# Patient Record
Sex: Female | Born: 1940 | Race: White | Hispanic: No | Marital: Married | Smoking: Former smoker
Health system: Southern US, Community
[De-identification: ages and names within clinical notes are randomized; demographics above are authoritative.]

## PROBLEM LIST (undated history)

## (undated) DIAGNOSIS — E785 Hyperlipidemia, unspecified: Secondary | ICD-10-CM

## (undated) DIAGNOSIS — I10 Essential (primary) hypertension: Secondary | ICD-10-CM

## (undated) DIAGNOSIS — M1990 Unspecified osteoarthritis, unspecified site: Secondary | ICD-10-CM

## (undated) DIAGNOSIS — I4891 Unspecified atrial fibrillation: Secondary | ICD-10-CM

## (undated) DIAGNOSIS — N6019 Diffuse cystic mastopathy of unspecified breast: Secondary | ICD-10-CM

## (undated) HISTORY — DX: Diffuse cystic mastopathy of unspecified breast: N60.19

## (undated) HISTORY — DX: Unspecified atrial fibrillation: I48.91

## (undated) HISTORY — DX: Essential (primary) hypertension: I10

## (undated) HISTORY — DX: Hyperlipidemia, unspecified: E78.5

## (undated) HISTORY — DX: Unspecified osteoarthritis, unspecified site: M19.90

---

## 1968-06-05 DIAGNOSIS — M1990 Unspecified osteoarthritis, unspecified site: Secondary | ICD-10-CM

## 1968-06-05 HISTORY — DX: Unspecified osteoarthritis, unspecified site: M19.90

## 1972-06-05 HISTORY — PX: ABDOMINAL HYSTERECTOMY: SHX81

## 1993-06-05 DIAGNOSIS — I10 Essential (primary) hypertension: Secondary | ICD-10-CM

## 1993-06-05 HISTORY — DX: Essential (primary) hypertension: I10

## 2008-07-06 ENCOUNTER — Ambulatory Visit: Payer: Self-pay | Admitting: Internal Medicine

## 2008-08-04 ENCOUNTER — Ambulatory Visit: Payer: Self-pay | Admitting: Family Medicine

## 2008-09-23 ENCOUNTER — Emergency Department: Payer: Self-pay | Admitting: Emergency Medicine

## 2008-10-19 DIAGNOSIS — D329 Benign neoplasm of meninges, unspecified: Secondary | ICD-10-CM

## 2008-10-20 ENCOUNTER — Emergency Department: Payer: Self-pay | Admitting: Emergency Medicine

## 2008-10-24 ENCOUNTER — Ambulatory Visit: Payer: Self-pay | Admitting: Internal Medicine

## 2008-10-24 ENCOUNTER — Emergency Department: Payer: Self-pay | Admitting: Emergency Medicine

## 2008-11-12 ENCOUNTER — Emergency Department: Payer: Self-pay | Admitting: Emergency Medicine

## 2009-06-05 DIAGNOSIS — N6019 Diffuse cystic mastopathy of unspecified breast: Secondary | ICD-10-CM

## 2009-06-05 HISTORY — PX: BREAST MASS EXCISION: SHX1267

## 2009-06-05 HISTORY — DX: Diffuse cystic mastopathy of unspecified breast: N60.19

## 2009-07-24 ENCOUNTER — Emergency Department: Payer: Self-pay | Admitting: Emergency Medicine

## 2009-08-06 ENCOUNTER — Emergency Department: Payer: Self-pay | Admitting: Emergency Medicine

## 2009-09-20 ENCOUNTER — Ambulatory Visit: Payer: Self-pay | Admitting: Family Medicine

## 2010-02-01 ENCOUNTER — Ambulatory Visit: Payer: Self-pay | Admitting: General Surgery

## 2010-02-09 ENCOUNTER — Ambulatory Visit: Payer: Self-pay | Admitting: General Surgery

## 2010-02-18 ENCOUNTER — Ambulatory Visit: Payer: Self-pay | Admitting: Internal Medicine

## 2011-02-03 DIAGNOSIS — E7800 Pure hypercholesterolemia, unspecified: Secondary | ICD-10-CM | POA: Insufficient documentation

## 2011-02-03 DIAGNOSIS — I10 Essential (primary) hypertension: Secondary | ICD-10-CM | POA: Insufficient documentation

## 2011-02-28 ENCOUNTER — Encounter: Payer: Self-pay | Admitting: Family Medicine

## 2011-03-06 ENCOUNTER — Encounter: Payer: Self-pay | Admitting: Family Medicine

## 2011-06-06 HISTORY — PX: COLONOSCOPY: SHX174

## 2011-06-16 ENCOUNTER — Ambulatory Visit: Payer: Self-pay | Admitting: Unknown Physician Specialty

## 2011-06-19 LAB — PATHOLOGY REPORT

## 2011-10-02 ENCOUNTER — Emergency Department: Payer: Self-pay | Admitting: Emergency Medicine

## 2012-01-06 ENCOUNTER — Ambulatory Visit: Payer: Self-pay | Admitting: Medical

## 2012-01-06 LAB — URINALYSIS, COMPLETE
Ketone: NEGATIVE
Nitrite: NEGATIVE
Ph: 5 (ref 4.5–8.0)

## 2012-01-08 LAB — URINE CULTURE

## 2012-01-10 DIAGNOSIS — R319 Hematuria, unspecified: Secondary | ICD-10-CM | POA: Insufficient documentation

## 2012-07-26 ENCOUNTER — Encounter: Payer: Self-pay | Admitting: *Deleted

## 2012-07-26 DIAGNOSIS — I10 Essential (primary) hypertension: Secondary | ICD-10-CM | POA: Insufficient documentation

## 2012-09-18 ENCOUNTER — Ambulatory Visit: Payer: Self-pay | Admitting: General Surgery

## 2012-10-17 ENCOUNTER — Encounter: Payer: Self-pay | Admitting: General Surgery

## 2012-10-24 ENCOUNTER — Ambulatory Visit: Payer: Self-pay | Admitting: General Surgery

## 2012-11-07 ENCOUNTER — Ambulatory Visit: Payer: Self-pay | Admitting: General Surgery

## 2012-11-21 ENCOUNTER — Ambulatory Visit (INDEPENDENT_AMBULATORY_CARE_PROVIDER_SITE_OTHER): Payer: Medicare Other | Admitting: General Surgery

## 2012-11-21 ENCOUNTER — Encounter: Payer: Self-pay | Admitting: General Surgery

## 2012-11-21 VITALS — BP 154/74 | HR 74 | Resp 14 | Ht 65.0 in | Wt 209.0 lb

## 2012-11-21 DIAGNOSIS — Z1239 Encounter for other screening for malignant neoplasm of breast: Secondary | ICD-10-CM

## 2012-11-21 NOTE — Progress Notes (Signed)
Patient ID: Amanda Mccann, female   DOB: [birth year], 72 y.o.   MRN: [id number]  Chief Complaint  Patient presents with  . Follow-up    mammogram    HPI Amanda Mccann is a 72 y.o. female Who presents for a breast evaluation. The most recent mammogram was done on 09/12/12.  Patient does perform regular self breast checks and gets regular mammograms done. No family history of breast cancer. Patient had an left breast mass excision in 2011.   HPI  Past Medical History  Diagnosis Date  . Hypertension 1995  . Arthritis 1970  . Diffuse cystic mastopathy 2011    left breast subareolar mass    Past Surgical History  Procedure Laterality Date  . Colonoscopy  2013    DR.Elliott  . Breast mass excision Left 2011  . Abdominal hysterectomy  1974    History reviewed. No pertinent family history.  Social History History  Substance Use Topics  . Smoking status: Former Smoker -- 1.00 packs/day for 10 years  . Smokeless tobacco: Not on file  . Alcohol Use: No    Allergies  Allergen Reactions  . Darvon (Propoxyphene Hcl) Swelling  . Indocin (Indomethacin) Swelling    Current Outpatient Prescriptions  Medication Sig Dispense Refill  . benazepril (LOTENSIN) 20 MG tablet Take 20 mg by mouth daily.      . Calcium Carbonate-Vitamin D (CALCIUM + D PO) Take 2 tablets by mouth daily.      Marland Kitchen losartan (COZAAR) 100 MG tablet Take 100 mg by mouth daily.      . metoprolol (LOPRESSOR) 50 MG tablet Take 50 mg by mouth 2 (two) times daily.      . Multiple Vitamins-Minerals (MULTIVITAMIN WITH MINERALS) tablet Take 1 tablet by mouth daily.      . sertraline (ZOLOFT) 50 MG tablet Take 150 mg by mouth daily.       No current facility-administered medications for this visit.    Review of Systems Review of Systems  Constitutional: Negative.   Respiratory: Negative.   Cardiovascular: Negative.     Blood pressure 154/74, pulse 74, resp. rate 14, height 5\' 5"  (1.651 m), weight 209 lb (94.802  kg).  Physical Exam Physical Exam  Constitutional: She appears well-developed and well-nourished.  Eyes: No scleral icterus.  Neck: No mass and no thyromegaly present.  Cardiovascular: Normal rate, regular rhythm and normal heart sounds.   Pulmonary/Chest: Effort normal and breath sounds normal. Right breast exhibits no inverted nipple, no mass, no nipple discharge, no skin change and no tenderness. Left breast exhibits no inverted nipple, no mass, no nipple discharge, no skin change and no tenderness.  Lymphadenopathy:    She has no cervical adenopathy.    She has no axillary adenopathy.  Neurological: She is alert.  Skin: Skin is warm and dry.    Data Reviewed Mammogram reviewed   Assessment    Exam stable     Plan   Patient to return in one year  Bilateral screening  mammogram.       SANKAR,SEEPLAPUTHUR G 11/21/2012, 9:01 PM

## 2012-11-21 NOTE — Patient Instructions (Addendum)
Patient to return in one year bilateral mammogram.

## 2013-10-09 ENCOUNTER — Inpatient Hospital Stay: Payer: Self-pay | Admitting: Internal Medicine

## 2013-10-09 LAB — URINALYSIS, COMPLETE
BILIRUBIN, UR: NEGATIVE
Bacteria: NONE SEEN
GLUCOSE, UR: NEGATIVE mg/dL (ref 0–75)
KETONE: NEGATIVE
Nitrite: NEGATIVE
Ph: 7 (ref 4.5–8.0)
Protein: NEGATIVE
RBC,UR: 1 /HPF (ref 0–5)
Specific Gravity: 1.004 (ref 1.003–1.030)
WBC UR: 7 /HPF (ref 0–5)

## 2013-10-09 LAB — BASIC METABOLIC PANEL
Anion Gap: 5 — ABNORMAL LOW (ref 7–16)
BUN: 21 mg/dL — ABNORMAL HIGH (ref 7–18)
CALCIUM: 9.2 mg/dL (ref 8.5–10.1)
CO2: 27 mmol/L (ref 21–32)
CREATININE: 0.83 mg/dL (ref 0.60–1.30)
Chloride: 108 mmol/L — ABNORMAL HIGH (ref 98–107)
EGFR (African American): >60
EGFR (Non-African Amer.): >60
GLUCOSE: 108 mg/dL — AB (ref 65–99)
Osmolality: 283 (ref 275–301)
POTASSIUM: 3.9 mmol/L (ref 3.5–5.1)
Sodium: 140 mmol/L (ref 136–145)

## 2013-10-09 LAB — CBC
HCT: 39.8 % (ref 35.0–47.0)
HGB: 13.3 g/dL (ref 12.0–16.0)
MCH: 29.6 pg (ref 26.0–34.0)
MCHC: 33.4 g/dL (ref 32.0–36.0)
MCV: 89 fL (ref 80–100)
Platelet: 290 10*3/uL (ref 150–440)
RBC: 4.5 10*6/uL (ref 3.80–5.20)
RDW: 13.9 % (ref 11.5–14.5)
WBC: 9.6 10*3/uL (ref 3.6–11.0)

## 2013-10-09 LAB — TSH: Thyroid Stimulating Horm: 1.28 u[IU]/mL

## 2013-10-09 LAB — TROPONIN I
TROPONIN-I: 0.04 ng/mL
Troponin-I: 0.05 ng/mL

## 2013-10-09 LAB — PRO B NATRIURETIC PEPTIDE: B-Type Natriuretic Peptide: 157 pg/mL — ABNORMAL HIGH (ref 0–125)

## 2013-10-09 LAB — CK-MB
CK-MB: 0.9 ng/mL (ref 0.5–3.6)
CK-MB: 0.9 ng/mL (ref 0.5–3.6)

## 2013-10-30 DIAGNOSIS — I480 Paroxysmal atrial fibrillation: Secondary | ICD-10-CM | POA: Insufficient documentation

## 2013-11-28 ENCOUNTER — Encounter: Payer: Self-pay | Admitting: General Surgery

## 2013-12-03 DIAGNOSIS — Z7901 Long term (current) use of anticoagulants: Secondary | ICD-10-CM | POA: Insufficient documentation

## 2013-12-09 ENCOUNTER — Ambulatory Visit: Payer: Medicare Other | Admitting: General Surgery

## 2013-12-24 ENCOUNTER — Ambulatory Visit: Payer: Medicare Other | Admitting: General Surgery

## 2013-12-27 ENCOUNTER — Emergency Department: Payer: Self-pay | Admitting: Emergency Medicine

## 2013-12-27 LAB — BASIC METABOLIC PANEL
ANION GAP: 7 (ref 7–16)
BUN: 18 mg/dL (ref 7–18)
CHLORIDE: 106 mmol/L (ref 98–107)
CO2: 26 mmol/L (ref 21–32)
CREATININE: 0.82 mg/dL (ref 0.60–1.30)
Calcium, Total: 8.5 mg/dL (ref 8.5–10.1)
EGFR (Non-African Amer.): >60
Glucose: 95 mg/dL (ref 65–99)
Osmolality: 279 (ref 275–301)
POTASSIUM: 3.8 mmol/L (ref 3.5–5.1)
Sodium: 139 mmol/L (ref 136–145)

## 2013-12-27 LAB — PROTIME-INR
INR: 2.2
Prothrombin Time: 23.8 secs — ABNORMAL HIGH (ref 11.5–14.7)

## 2013-12-27 LAB — CBC
HCT: 37 % (ref 35.0–47.0)
HGB: 12.6 g/dL (ref 12.0–16.0)
MCH: 30.1 pg (ref 26.0–34.0)
MCHC: 34.2 g/dL (ref 32.0–36.0)
MCV: 88 fL (ref 80–100)
Platelet: 292 10*3/uL (ref 150–440)
RBC: 4.19 10*6/uL (ref 3.80–5.20)
RDW: 14 % (ref 11.5–14.5)
WBC: 9.6 10*3/uL (ref 3.6–11.0)

## 2013-12-27 LAB — TROPONIN I

## 2013-12-28 LAB — TROPONIN I

## 2014-01-06 ENCOUNTER — Encounter: Payer: Self-pay | Admitting: General Surgery

## 2014-01-06 ENCOUNTER — Ambulatory Visit (INDEPENDENT_AMBULATORY_CARE_PROVIDER_SITE_OTHER): Payer: Medicare HMO | Admitting: General Surgery

## 2014-01-06 VITALS — BP 130/82 | HR 68 | Resp 14 | Ht 65.0 in | Wt 207.0 lb

## 2014-01-06 DIAGNOSIS — N6019 Diffuse cystic mastopathy of unspecified breast: Secondary | ICD-10-CM

## 2014-01-06 NOTE — Patient Instructions (Signed)
Patient to return in 1 year with bilateral screening mammogram. Continue self breast exams. Call office for any new breast issues or concerns.  

## 2014-01-06 NOTE — Progress Notes (Signed)
Patient ID: Amanda Mccann, female   DOB: Jul 10, 1940, 73 y.o.   MRN: [id number]  Chief Complaint  Patient presents with  . Follow-up    mammogram    HPI Amanda Mccann is a 73 y.o. female who presents for a breast evaluation. The most recent mammogram was done on 11/27/13. Patient does perform regular self breast checks and gets regular mammograms done.  No complaints at this time. Since last visit the patient has developed Afib with a heart rate up to 140 when diagnosed. Now doing ok, she is on Warfarin.   HPI  Past Medical History  Diagnosis Date  . Hypertension 1995  . Arthritis 1970  . Diffuse cystic mastopathy 2011    left breast subareolar mass  . A-fib     Past Surgical History  Procedure Laterality Date  . Colonoscopy  2013    DR.Elliott  . Breast mass excision Left 2011  . Abdominal hysterectomy  1974    History reviewed. No pertinent family history.  Social History History  Substance Use Topics  . Smoking status: Former Smoker -- 1.00 packs/day for 10 years  . Smokeless tobacco: Not on file  . Alcohol Use: No    Allergies  Allergen Reactions  . Darvon [Propoxyphene Hcl] Swelling  . Indocin [Indomethacin] Swelling    Current Outpatient Prescriptions  Medication Sig Dispense Refill  . amLODipine (NORVASC) 5 MG tablet Take 10 mg by mouth daily.      . benazepril (LOTENSIN) 20 MG tablet Take 60 mg by mouth daily.       . Calcium Carbonate-Vitamin D (CALCIUM + D PO) Take 2 tablets by mouth daily.      . hydrochlorothiazide (HYDRODIURIL) 25 MG tablet Take 25 mg by mouth as needed.       Marland Kitchen losartan (COZAAR) 100 MG tablet Take 100 mg by mouth daily.      . metoprolol succinate (TOPROL-XL) 50 MG 24 hr tablet Take 50 mg by mouth daily. Take with or immediately following a meal.      . Multiple Vitamins-Minerals (MULTIVITAMIN WITH MINERALS) tablet Take 1 tablet by mouth daily.      . sertraline (ZOLOFT) 50 MG tablet Take 150 mg by mouth daily.      Marland Kitchen warfarin  (COUMADIN) 1 MG tablet 0.5 mg by mouth MWF and 2.5 mg all other days.       No current facility-administered medications for this visit.    Review of Systems Review of Systems  Constitutional: Negative.   Respiratory: Negative.   Cardiovascular: Negative.     Blood pressure 130/82, pulse 68, resp. rate 14, height 5\' 5"  (1.651 m), weight 207 lb (93.895 kg).  Physical Exam Physical Exam  Constitutional: She is oriented to person, place, and time. She appears well-developed and well-nourished.  Neck: Neck supple. No thyromegaly present.  Cardiovascular: Normal rate, regular rhythm and normal heart sounds.   No murmur heard. Pulmonary/Chest: Effort normal and breath sounds normal. Right breast exhibits no inverted nipple, no mass, no nipple discharge, no skin change and no tenderness. Left breast exhibits no inverted nipple, no mass, no nipple discharge, no skin change and no tenderness.  Lymphadenopathy:    She has no cervical adenopathy.    She has no axillary adenopathy.  Neurological: She is alert and oriented to person, place, and time.  Skin: Skin is warm and dry.    Data Reviewed Mammogram reviewed and stable.   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 Current exam stable.     Plan    1 year follow bilateral screening mammogram.        Gionni Freese G 01/07/2014, 2:11 PM

## 2014-01-07 ENCOUNTER — Encounter: Payer: Self-pay | Admitting: General Surgery

## 2014-04-06 ENCOUNTER — Encounter: Payer: Self-pay | Admitting: General Surgery

## 2014-05-14 ENCOUNTER — Ambulatory Visit: Payer: Self-pay | Admitting: Family Medicine

## 2014-06-24 DIAGNOSIS — E785 Hyperlipidemia, unspecified: Secondary | ICD-10-CM | POA: Insufficient documentation

## 2014-09-26 NOTE — H&P (Signed)
PATIENT NAME:  Amanda Mccann, Amanda Mccann MR#:  [id number] DATE OF BIRTH:  August 26, 1940  DATE OF ADMISSION:  10/09/2013  PRIMARY CARE PHYSICIAN:  Dr. Gayland Curry.   REFERRING PHYSICIAN:  Dr. Reita Cliche.   CHIEF COMPLAINT:  Palpitations.   HISTORY OF PRESENT ILLNESS:  The patient is a 74 year old pleasant Caucasian female with past medical history of hypertension and anxiety who is presenting to the ER with a chief complaint of more than one hour history of palpitations.  The patient was in her usual state of health until last night.  The patient woke up from sleep at around midnight to go to bathroom and while she was in the bathroom she started having palpitations in her chest.  She was uncomfortable with the chest discomfort.  Denies any dizziness or loss of consciousness.  The patient went back to bed and laid down to see whether the chest discomfort will be resolved.  The patient's chest discomfort was not resolved and was having persistent palpitations which prompted her come to the ER.  In the ER, the patient was found with atrial fibrillation with RVR at 160 beats per minute.  The patient was given Cardizem 10 mg IV push 2 times with no significant improvement.  The patient is started on Cardizem drip and hospitalist team is called to admit the patient.  During my examination, the patient's heart rate is trending down up to 110 to 120s, but with exertion it is going up to 140s.  Husband is at bedside.  No similar complaints in the past.  Never had any heart attack or palpitations in the past.  Not seen by any cardiologist in the past.   PAST MEDICAL HISTORY:  Hypertension and anxiety.   PAST SURGICAL HISTORY:  Hysterectomy for endometriosis in 1974.   ALLERGIES:  CALCIUM CARBONATE, INDOCIN, DARVON.   PSYCHOSOCIAL HISTORY:  Lives at home with husband.  No history of smoking, alcohol or illicit drug usage.  SHE IS FULL CODE.   FAMILY HISTORY:  Mother had history of hypertension.  Grandmom has history of  stroke.    HOME MEDICATIONS:  Calcium carbonate with vitamin D 600 mg by mouth 2 times a day, benazepril 20 mg 2 tablets by mouth once daily in a.m., aspirin 81 mg by mouth once daily, losartan 100 mg by mouth once daily, hydrochlorothiazide 25 mg 1 tablet by mouth q.a.m., ciprofloxacin 500 mg 2 times a day, but the patient denies taking Cipro.  Sertraline 50 mg by mouth 2 times a day, multivitamin 1 tablet by mouth once daily.   REVIEW OF SYSTEMS:  CONSTITUTIONAL:  Denies any fever or fatigue.  EYES:  Denies blurry vision, double vision, glaucoma, cataracts.  EARS, NOSE, THROAT:  Denies epistaxis or discharge.  RESPIRATION:  Denies cough, COPD or tuberculosis.  CARDIOVASCULAR:  No chest pain.  Complaining of palpitations.  Denies any dizziness or loss of consciousness.   GASTROINTESTINAL:  Denies any hematemesis, melena, nausea, vomiting, diarrhea or abdominal pain.   NEUROLOGIC:  Denies any history of vertigo, ataxia, stroke or TIA in the past.  MUSCULOSKELETAL:  Denies any osteoarthritis, gout.  HEMATOLOGIC AND LYMPHATIC:  Denies any anemia or recent cancers, recent history of blood losses.  PSYCHIATRIC:  Denies any ADD, OCD, anxiety, depression.  SKIN:  No rashes.  No lesions.   LABORATORY AND IMAGING STUDIES:  CBC normal.  Urinalysis:  Leukocyte esterase is 1+, nitrite is negative.  Chem-8:  Glucose is 108, BNP 157, BUN 21, chloride 108.  Anion gap  5.  The rest of the BMP is normal.  Chest x-ray, portable:  Mild cardiomegaly.  No effusion or pneumothorax.  Negative for edema.  A 12-lead EKG:  Atrial fibrillation with RVR at 160 beats per minute.  Significant ST-T abnormalities, but no acute ST-T wave changes.   ASSESSMENT AND PLAN:  A 74 year old Caucasian female presenting to the Emergency Room with a chief complaint of palpitations for more than one hour.  1.  New onset atrial fibrillation with rapid ventricular rate.  We will admit her to Critical Care Unit.  The patient is on Cardizem  drip.  We will continue that.  We will continue the patient's home medication, beta blocker.  She will be on aspirin.  Lovenox 1 mg/kg subQ q. 12 hours, but her CHADS score is low, 1 to 2, probably the patient will be discharged home with aspirin for anticoagulation, but cardiology consult is placed to Dr. Neoma Laming on-call cardiologist, regarding anticoagulation.  Cycle cardiac biomarkers and we will obtain 2-D echocardiogram. 2.  Hypertension.  Resume her home medications and up-titrate medications as needed basis.  3.  History of anxiety.  Continue her home medication.   4.  We will provide gastrointestinal prophylaxis and deep vein thrombosis prophylaxis with Lovenox subQ.   The diagnosis and plan of care was discussed in detail with the patient and her husband at bedside.  They both noted understanding of the plan.   Total critical care time spent is 50 minutes.     ____________________________ Nicholes Mango, MD ag:ea D: 10/09/2013 03:24:29 ET T: 10/09/2013 04:50:45 ET JOB#: [id number]  cc: Nicholes Mango, MD, <Dictator> Dionisio David, MD Floria Raveling. Astrid Divine, MD  Nicholes Mango MD ELECTRONICALLY SIGNED 10/12/2013 4:37

## 2014-09-26 NOTE — Consult Note (Signed)
PATIENT NAME:  Amanda Mccann, Amanda Mccann MR#:  [id number] DATE OF BIRTH:  01/31/1941  DATE OF CONSULTATION:  10/09/2013  CONSULTING PHYSICIAN:  Dionisio David, MD  INDICATION FOR CONSULTATION: Atrial fibrillation, chest pain.   HISTORY OF PRESENT ILLNESS: This is a 74 year old white female with a past medical history of hypertension, takes Toprol-XL 50 mg once a day and lisinopril, presented to the Emergency Room with new onset atrial fibrillation. The patient had chest pain described as tightness in the chest associated with palpitations. She has converted to sinus rhythm with IV Cardizem.   PAST MEDICAL HISTORY: History of hypertension. Her CHADS2 1 to 2.   SOCIAL HISTORY:  Denies EtOH abuse or smoking.   FAMILY HISTORY:  Positive for coronary artery disease.   PHYSICAL EXAMINATION: GENERAL:  She is alert, oriented x 3, in no acute distress.  VITAL SIGNS:  Stable. Heart rate is 56. Monitor shows sinus rhythm.  NECK:  No JVD.  LUNGS:  Clear.  HEART:  Regular rate and rhythm. Normal S1, S2. No audible murmur.  ABDOMEN:  Soft, nontender, positive bowel sounds.  EXTREMITIES:  No pedal edema.   EKG shows Afib with ventricular rate of 160 with ST depression inferolaterally.   Troponins were apparently negative.   Echo showed normal EF, mild MR, trace TR with septal hypokinesis suggestive of coronary artery disease.   ASSESSMENT AND PLAN: The patient has atrial fibrillation with rapid ventricular response rate, responded with Cardizem and became sinus rhythm. Agree with discharging the patient on metoprolol tartrate 50 b.i.d., aspirin, and we will schedule a stress test at 8:30 on Monday in the office, which has already been scheduled to rule out coronary artery disease.    ____________________________ Dionisio David, MD sak:dmm D: 10/09/2013 12:50:44 ET T: 10/09/2013 13:07:05 ET JOB#: [id number]  cc: Dionisio David, MD, <Dictator> [place], [place]  MD ELECTRONICALLY SIGNED 10/10/2013 11:07

## 2014-09-26 NOTE — Discharge Summary (Signed)
PATIENT NAME:  Amanda Mccann, COOTE MR#:  [id number] DATE OF BIRTH:  [birth year]  DATE OF ADMISSION:  10/09/2013 DATE OF DISCHARGE:  10/09/2013  PRIMARY CARE PHYSICIAN:  Dr. Gayland Curry.   FINAL DIAGNOSES:  1. Rapid atrial fibrillation.  2. Urinary tract infection.  3. Hypertension.  4. Anxiety.   MEDICATIONS ON DISCHARGE: Include aspirin 81 mg at bedtime, benazepril 20 mg 2 tablets in a.m. and1 tablet in the p.m., losartan 100 mg at bedtime, multivitamin 1 tablet in the a.m., Zoloft 50 mg twice a day, calcium carbonate bid, vitamin e 400 international units 1 tablet twice a day, metoprolol tartrate 50 mg twice a day, cephalexin 250 mg orally every 8 hours for 4 days, magnesium oxide 400 mg daily for 5 days.   Stop taking hydrochlorothiazide.   DIET: Low sodium diet, regular consistency.   ACTIVITY: As tolerated.   FOLLOWUP:  Dr. Humphrey Rolls, cardiology, Monday at 8: 30 a.m.  One to 2 weeks with Dr. Gayland Curry.   HOSPITAL COURSE: The patient was admitted 10/09/2013.  Discharged same day.  Came in with rapid atrial fibrillation. Was admitted to the ICU, started on Cardizem drip.  LABORATORY AND RADIOLOGICAL DATA DURING THE HOSPITAL COURSE: Included a urinalysis, 1+ leukocyte esterase. Troponin negative. White blood cell count 9.6, H and H 13.3 and 39.8, platelet count of 290,000. BNP 157, glucose 108, BUN 21, creatinine 0.83, sodium 140, potassium 3.9, chloride 108, CO2 of 27, calcium 9.2. EKG: Atrial fibrillation, rapid ventricular response, ST depression laterally. Chest x-ray: Mild cardiomegaly. No evidence of edema or pneumonia. TSH 1.28.  Cardiac enzymes negative. Echocardiogram showed EF of 60%-65%, impaired relaxation pattern. No thrombi seen. Hypokinesis septally.  Left ventricular hypertrophy, moderately dilated left atrium, moderately dilated right atrium.   HOSPITAL COURSE PER PROBLEM LIST:  1. For the patient's rapid atrial fibrillation, the patient was placed on Cardizem drip. I  gave the patient metoprolol 50 mg stat before I saw her. Then, she converted over to a normal sinus rhythm. We got her off the Cardizem drip. I switched her Toprol over to metoprolol tartrate 50 mg twice a day. Dr. Humphrey Rolls, cardiology, saw her. Recommended aspirin only for anticoagulation at this point and followup in his office on Monday for a possible stress test. I did prescribe a few days worth of magnesium oxide.  2. Urinary tract infection. The patient was started on Rocephin.  Cephalexin will be prescribed upon discharge.  3. Hypertension. Blood pressure is stable. I did stop the hydrochlorothiazide since I increased the patient to metoprolol twice a day.  4. Anxiety. On Zoloft.   TIME SPENT ON DISCHARGE: 35 minutes.    ____________________________ Tana Conch. Leslye Peer, MD rjw:dd D: 10/09/2013 15:48:22 ET T: 10/10/2013 02:03:53 ET JOB#: [id number]  cc: Tana Conch. Leslye Peer, MD, <Dictator> Floria Raveling. Astrid Divine, MD Dionisio David, MD  Marisue Brooklyn MD ELECTRONICALLY SIGNED 10/12/2013 14:47

## 2014-12-23 ENCOUNTER — Ambulatory Visit: Payer: Medicare HMO | Admitting: General Surgery

## 2015-06-05 ENCOUNTER — Other Ambulatory Visit: Payer: Self-pay | Admitting: Internal Medicine

## 2015-06-05 ENCOUNTER — Encounter: Payer: Self-pay | Admitting: Internal Medicine

## 2015-06-05 DIAGNOSIS — D329 Benign neoplasm of meninges, unspecified: Secondary | ICD-10-CM

## 2015-06-05 DIAGNOSIS — F419 Anxiety disorder, unspecified: Secondary | ICD-10-CM | POA: Insufficient documentation

## 2015-06-09 ENCOUNTER — Ambulatory Visit: Payer: Self-pay | Admitting: Internal Medicine

## 2015-06-13 ENCOUNTER — Encounter: Payer: Self-pay | Admitting: Internal Medicine

## 2015-06-16 ENCOUNTER — Encounter: Payer: Self-pay | Admitting: Internal Medicine

## 2015-06-16 ENCOUNTER — Ambulatory Visit (INDEPENDENT_AMBULATORY_CARE_PROVIDER_SITE_OTHER): Payer: PPO | Admitting: Internal Medicine

## 2015-06-16 VITALS — BP 128/76 | HR 86 | Ht 66.0 in | Wt 209.0 lb

## 2015-06-16 DIAGNOSIS — Z9229 Personal history of other drug therapy: Secondary | ICD-10-CM

## 2015-06-16 DIAGNOSIS — N6011 Diffuse cystic mastopathy of right breast: Secondary | ICD-10-CM | POA: Diagnosis not present

## 2015-06-16 DIAGNOSIS — N6012 Diffuse cystic mastopathy of left breast: Secondary | ICD-10-CM | POA: Diagnosis not present

## 2015-06-16 DIAGNOSIS — I10 Essential (primary) hypertension: Secondary | ICD-10-CM | POA: Diagnosis not present

## 2015-06-16 DIAGNOSIS — I480 Paroxysmal atrial fibrillation: Secondary | ICD-10-CM

## 2015-06-16 DIAGNOSIS — Z1239 Encounter for other screening for malignant neoplasm of breast: Secondary | ICD-10-CM

## 2015-06-16 DIAGNOSIS — E785 Hyperlipidemia, unspecified: Secondary | ICD-10-CM

## 2015-06-16 MED ORDER — BENAZEPRIL HCL 20 MG PO TABS: 60.0000 mg | ORAL_TABLET | Freq: Every day | ORAL | Status: DC

## 2015-06-16 NOTE — Progress Notes (Signed)
Date:  06/16/2015   Name:  Amanda Mccann   DOB:  1940-07-27   MRN:  [id number]   Chief Complaint: New Evaluation; Atrial Fibrillation; and Hypertension Atrial Fibrillation Presents for follow-up (onset 2002) visit. Symptoms include hypertension. Symptoms are negative for chest pain, dizziness, shortness of breath and tachycardia. The symptoms have been resolved. Past treatments include warfarin and beta blockers. Compliance with prior treatments has been good. Past medical history includes atrial fibrillation.  Hypertension This is a chronic problem. The current episode started more than 1 year ago. The problem is unchanged. The problem is controlled (intermittently increases - current therapy for the past 5 years). Associated symptoms include anxiety. Pertinent negatives include no chest pain, headaches or shortness of breath. Past treatments include beta blockers, angiotensin blockers, ACE inhibitors, diuretics and calcium channel blockers. The current treatment provides significant improvement. There are no compliance problems.   Anxiety Presents for follow-up visit. The problem has been unchanged. Symptoms include decreased concentration, nervous/anxious behavior and panic. Patient reports no chest pain, confusion, dizziness or shortness of breath. Symptoms occur rarely.   Past treatments include SSRIs. The treatment provided significant relief. Compliance with prior treatments has been good.    Review of Systems  Constitutional: Negative for fever, chills and fatigue.  HENT: Negative for hearing loss.   Eyes: Negative for visual disturbance.  Respiratory: Negative for shortness of breath.   Cardiovascular: Negative for chest pain.  Genitourinary: Negative for hematuria.  Musculoskeletal: Negative for back pain, arthralgias and gait problem.  Skin: Negative for color change and rash.  Neurological: Negative for dizziness, tremors, numbness and headaches.  Hematological: Negative  for adenopathy. Bruises/bleeds easily.  Psychiatric/Behavioral: Positive for decreased concentration. Negative for confusion and dysphoric mood. The patient is nervous/anxious.     Patient Active Problem List   Diagnosis Date Noted  . Anxiety 06/05/2015  . HLD (hyperlipidemia) 06/24/2014  . History of anticoagulant therapy 12/03/2013  . AF (paroxysmal atrial fibrillation) ([place]) 10/30/2013  . Blood in the urine 01/10/2012  . Essential (primary) hypertension 02/03/2011  . 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 Authorizing Provider  amLODipine (NORVASC) 10 MG tablet Take 10 mg by mouth daily.   Yes Historical Provider, MD  benazepril (LOTENSIN) 20 MG tablet Take 60 mg by mouth daily.    Yes Historical Provider, MD  hydrochlorothiazide (HYDRODIURIL) 25 MG tablet Take 25 mg by mouth as needed.    Yes Historical Provider, MD  losartan (COZAAR) 100 MG tablet Take 100 mg by mouth daily.   Yes Historical Provider, MD  metoprolol succinate (TOPROL-XL) 50 MG 24 hr tablet Take 50 mg by mouth daily. Take with or immediately following a meal.   Yes Historical Provider, MD  Multiple Vitamins-Minerals (MULTIVITAMIN WITH MINERALS) tablet Take 1 tablet by mouth daily.   Yes Historical Provider, MD  sertraline (ZOLOFT) 50 MG tablet Take 150 mg by mouth daily.   Yes Historical Provider, MD  warfarin (COUMADIN) 3 MG tablet Take 1 tablet by mouth daily. 05/05/15  Yes Historical Provider, MD    Allergies  Allergen Reactions  . Darvon [Propoxyphene Hcl] Swelling  . Indocin [Indomethacin] Swelling    Past Surgical History  Procedure Laterality Date  . Colonoscopy  2013    DR.Elliott  . Breast mass excision Left 2011  . Abdominal hysterectomy  1974    Social History  Substance Use Topics  . Smoking status:  Former Smoker -- 1.00 packs/day for 10 years    Quit date: 06/05/1978  . Smokeless tobacco: None  . Alcohol  Use: No    Medication list has been reviewed and updated.   Physical Exam  Constitutional: She is oriented to person, place, and time. She appears well-developed and well-nourished. No distress.  HENT:  Head: Normocephalic and atraumatic.  Eyes: Conjunctivae are normal. Right eye exhibits no discharge. Left eye exhibits no discharge. No scleral icterus.  Neck: Normal range of motion. Neck supple. Carotid bruit is not present.  Cardiovascular: Normal rate, regular rhythm and normal heart sounds.   Pulmonary/Chest: Effort normal and breath sounds normal. No respiratory distress.  Musculoskeletal: Normal range of motion. She exhibits no edema or tenderness.  Lymphadenopathy:    She has no cervical adenopathy.  Neurological: She is alert and oriented to person, place, and time. She has normal reflexes.  Skin: Skin is warm and dry. No rash noted.  Psychiatric: She has a normal mood and affect. Her speech is normal and behavior is normal. Thought content normal.    BP 108/58 mmHg  Pulse 60  Ht 5\' 6"  (1.676 m)  Wt 209 lb (94.802 kg)  BMI 33.75 kg/m2  SpO2 96%  Assessment and Plan: 1. Essential (primary) hypertension Consider Diovan or Benicar in place of benzepril and losartan (pt will check formulary coverage) - benazepril (LOTENSIN) 20 MG tablet; Take 3 tablets (60 mg total) by mouth daily.  Dispense: 30 tablet; Refill: 1  2. AF (paroxysmal atrial fibrillation) ([facility]) Very rare episodes; consider Xarelto or Eliquis instead of warfarin - Protime-INR; Standing  3. HLD (hyperlipidemia) Borderline; no medications recommended  4. History of anticoagulant therapy On Warfarin 5 mg per day - discussed taking it by itself and at least 2 hours from a meal - Protime-INR - Protime-INR; Standing  5. Fibrocystic breast changes of both breasts Previously seen by Dr. Jamal Collin  6. Breast cancer screening - MM DIGITAL SCREENING BILATERAL; Future   Halina Maidens, MD [facility]  06/16/2015

## 2015-06-17 ENCOUNTER — Telehealth: Payer: Self-pay

## 2015-06-17 LAB — PROTIME-INR
INR: 1.8 — AB (ref 0.8–1.2)
Prothrombin Time: 18.1 s — ABNORMAL HIGH (ref 9.1–12.0)

## 2015-06-17 NOTE — Telephone Encounter (Signed)
Left message for patient to call back.  Thanks!

## 2015-06-17 NOTE — Telephone Encounter (Signed)
-----   Message from Glean Hess, MD sent at 06/17/2015  7:59 AM EST ----- INR is slightly low.  Before I increase the dose, I want you to take warfarin as discussed at 4 PM daily without food or other medication.  Return for INR in 2 weeks.

## 2015-06-18 NOTE — Telephone Encounter (Signed)
Spoke with pt. Pt. Advised of results and verbalized understanding. [facility]

## 2015-06-29 ENCOUNTER — Other Ambulatory Visit: Payer: Self-pay | Admitting: Internal Medicine

## 2015-06-29 ENCOUNTER — Other Ambulatory Visit: Payer: Self-pay

## 2015-06-29 DIAGNOSIS — I480 Paroxysmal atrial fibrillation: Secondary | ICD-10-CM

## 2015-06-29 DIAGNOSIS — Z9229 Personal history of other drug therapy: Secondary | ICD-10-CM

## 2015-06-29 MED ORDER — SERTRALINE HCL 50 MG PO TABS: 150.0000 mg | ORAL_TABLET | Freq: Every day | ORAL | Status: DC

## 2015-06-29 NOTE — Telephone Encounter (Signed)
Patient came in today and requested refill to new mail order pharmacy.

## 2015-06-30 ENCOUNTER — Other Ambulatory Visit: Payer: Self-pay

## 2015-06-30 MED ORDER — WARFARIN SODIUM 3 MG PO TABS: 5.0000 mg | ORAL_TABLET | Freq: Every day | ORAL | Status: DC

## 2015-06-30 NOTE — Telephone Encounter (Signed)
Patient came in to office requesting refill be sent to local pharmacy.

## 2015-07-04 ENCOUNTER — Other Ambulatory Visit: Payer: Self-pay | Admitting: Internal Medicine

## 2015-07-05 ENCOUNTER — Telehealth: Payer: Self-pay

## 2015-07-05 ENCOUNTER — Other Ambulatory Visit
Admission: RE | Admit: 2015-07-05 | Discharge: 2015-07-05 | Disposition: A | Discharge status: Home / Self Care | Payer: PPO | Source: Ambulatory Visit | Attending: Internal Medicine | Admitting: Internal Medicine

## 2015-07-05 DIAGNOSIS — Z9229 Personal history of other drug therapy: Secondary | ICD-10-CM | POA: Insufficient documentation

## 2015-07-05 DIAGNOSIS — I480 Paroxysmal atrial fibrillation: Secondary | ICD-10-CM | POA: Insufficient documentation

## 2015-07-05 LAB — PROTIME-INR
INR: 1.73
Prothrombin Time: 20.2 seconds — ABNORMAL HIGH (ref 11.4–15.0)

## 2015-07-05 NOTE — Telephone Encounter (Signed)
-----   Message from Glean Hess, MD sent at 07/04/2015 12:16 PM EST ----- Please verify with her that she is taking 3 - 20 mg Benzepril daily or 60 mg per day.

## 2015-07-05 NOTE — Telephone Encounter (Signed)
Spoke with pt. Pt. Will have INR drawn today at [facility].

## 2015-07-05 NOTE — Telephone Encounter (Signed)
Spoke with pt. Pt. States that she is taking her benazepril 3-20 mg tablets. Also states that the other medications discussed at her visit are not currently on her formulary.

## 2015-07-06 ENCOUNTER — Telehealth: Payer: Self-pay

## 2015-07-06 DIAGNOSIS — L918 Other hypertrophic disorders of the skin: Secondary | ICD-10-CM | POA: Diagnosis not present

## 2015-07-06 DIAGNOSIS — Z1283 Encounter for screening for malignant neoplasm of skin: Secondary | ICD-10-CM | POA: Diagnosis not present

## 2015-07-06 DIAGNOSIS — L728 Other follicular cysts of the skin and subcutaneous tissue: Secondary | ICD-10-CM | POA: Diagnosis not present

## 2015-07-06 NOTE — Telephone Encounter (Signed)
-----   Message from Glean Hess, MD sent at 07/05/2015  4:51 PM EST ----- INR is still low.  I want to increase warfarin to 5 mg per day.  Recheck INR in 2 weeks.

## 2015-07-06 NOTE — Telephone Encounter (Signed)
Spoke with patient. Patient advised of all results and verbalized understanding. Will call back with any future questions or concerns. MAH  

## 2015-07-19 ENCOUNTER — Other Ambulatory Visit
Admission: RE | Admit: 2015-07-19 | Discharge: 2015-07-19 | Disposition: A | Discharge status: Home / Self Care | Payer: PPO | Source: Ambulatory Visit | Attending: Internal Medicine | Admitting: Internal Medicine

## 2015-07-19 ENCOUNTER — Other Ambulatory Visit: Payer: Self-pay

## 2015-07-19 DIAGNOSIS — Z5181 Encounter for therapeutic drug level monitoring: Secondary | ICD-10-CM | POA: Insufficient documentation

## 2015-07-19 DIAGNOSIS — Z7901 Long term (current) use of anticoagulants: Secondary | ICD-10-CM | POA: Diagnosis not present

## 2015-07-19 DIAGNOSIS — I480 Paroxysmal atrial fibrillation: Secondary | ICD-10-CM

## 2015-07-19 DIAGNOSIS — Z9229 Personal history of other drug therapy: Secondary | ICD-10-CM

## 2015-07-20 ENCOUNTER — Telehealth: Payer: Self-pay

## 2015-07-20 LAB — PROTIME-INR
INR: 4.23 — AB (ref 0.00–1.49)
Prothrombin Time: 39.6 seconds — ABNORMAL HIGH (ref 11.4–15.0)

## 2015-07-20 NOTE — Telephone Encounter (Signed)
-----   Message from Glean Hess, MD sent at 07/20/2015  3:10 PM EST ----- Go back to 4.5 mg warfarin per day.  May continue to eat greens at the same rate.  Recheck in 2 weeks.

## 2015-07-20 NOTE — Telephone Encounter (Signed)
Spoke with patient. Patient advised of all results and verbalized understanding. Will call back with any future questions or concerns. MAH  

## 2015-07-20 NOTE — Telephone Encounter (Signed)
[facility] called in this morning with a Critical lab value. PT- 39.6 INR- 4.23

## 2015-08-02 DIAGNOSIS — I4891 Unspecified atrial fibrillation: Secondary | ICD-10-CM | POA: Diagnosis not present

## 2015-08-02 DIAGNOSIS — E7800 Pure hypercholesterolemia, unspecified: Secondary | ICD-10-CM | POA: Diagnosis not present

## 2015-08-02 DIAGNOSIS — R319 Hematuria, unspecified: Secondary | ICD-10-CM | POA: Diagnosis not present

## 2015-08-02 DIAGNOSIS — I10 Essential (primary) hypertension: Secondary | ICD-10-CM | POA: Diagnosis not present

## 2015-08-02 DIAGNOSIS — I480 Paroxysmal atrial fibrillation: Secondary | ICD-10-CM | POA: Diagnosis not present

## 2015-08-16 ENCOUNTER — Ambulatory Visit (INDEPENDENT_AMBULATORY_CARE_PROVIDER_SITE_OTHER): Payer: PPO | Admitting: Internal Medicine

## 2015-08-16 ENCOUNTER — Encounter: Payer: Self-pay | Admitting: Internal Medicine

## 2015-08-16 VITALS — BP 122/66 | HR 64 | Ht 66.0 in | Wt 201.0 lb

## 2015-08-16 DIAGNOSIS — B029 Zoster without complications: Secondary | ICD-10-CM

## 2015-08-16 MED ORDER — VALACYCLOVIR HCL 1 G PO TABS: 1000.0000 mg | ORAL_TABLET | Freq: Three times a day (TID) | ORAL | Status: DC

## 2015-08-16 NOTE — Patient Instructions (Signed)
Shingles Shingles is an infection that causes a painful skin rash and fluid-filled blisters. Shingles is caused by the same virus that causes chickenpox. Shingles only develops in people who:  Have had chickenpox.  Have gotten the chickenpox vaccine. (This is rare.) The first symptoms of shingles may be itching, tingling, or pain in an area on your skin. A rash will follow in a few days or weeks. The rash is usually on one side of the body in a bandlike or beltlike pattern. Over time, the rash turns into fluid-filled blisters that break open, scab over, and dry up. Medicines may:  Help you manage pain.  Help you recover more quickly.  Help to prevent long-term problems. HOME CARE Medicines  Take medicines only as told by your doctor.  Apply an anti-itch or numbing cream to the affected area as told by your doctor. Blister and Rash Care  Take a cool bath or put cool compresses on the area of the rash or blisters as told by your doctor. This may help with pain and itching.  Keep your rash covered with a loose bandage (dressing). Wear loose-fitting clothing.  Keep your rash and blisters clean with mild soap and cool water or as told by your doctor.  Check your rash every day for signs of infection. These include redness, swelling, and pain that lasts or gets worse.  Do not pick your blisters.  Do not scratch your rash. General Instructions  Rest as told by your doctor.  Keep all follow-up visits as told by your doctor. This is important.  Until your blisters scab over, your infection can cause chickenpox in people who have never had it or been vaccinated against it. To prevent this from happening, avoid touching other people or being around other people, especially:  Babies.  Pregnant women.  Children who have eczema.  Elderly people who have transplants.  People who have chronic illnesses, such as leukemia or AIDS. GET HELP IF:  Your pain does not get better with  medicine.  Your pain does not get better after the rash heals.  Your rash looks infected. Signs of infection include:  Redness.  Swelling.  Pain that lasts or gets worse. GET HELP RIGHT AWAY IF:  The rash is on your face or nose.  You have pain in your face, pain around your eye area, or loss of feeling on one side of your face.  You have ear pain or you have ringing in your ear.  You have loss of taste.  Your condition gets worse.   This information is not intended to replace advice given to you by your health care provider. Make sure you discuss any questions you have with your health care provider.   Document Released: 11/08/2007 Document Revised: 06/12/2014 Document Reviewed: 03/03/2014 Elsevier Interactive Patient Education 2016 [employer] Inc[employer]  

## 2015-08-16 NOTE — Progress Notes (Signed)
Date:  08/16/2015   Name:  Amanda Mccann   DOB:  March 26, 1941   MRN:  [id number]   Chief Complaint: Arm Pain Arm Pain  The incident occurred 12 to 24 hours ago. The incident occurred at home. There was no injury mechanism. The pain is present in the right shoulder and upper right arm. The quality of the pain is described as aching and shooting. The pain radiates to the right hand. The pain has been constant since the incident. Pertinent negatives include no chest pain, numbness or tingling.  Rash This is a new problem. The current episode started today. The problem is unchanged. The affected locations include the right arm. The rash is characterized by redness, swelling and blistering. She was exposed to nothing. Pertinent negatives include no fever, joint pain, shortness of breath or sore throat. Past treatments include analgesics. The treatment provided mild relief.      Review of Systems  Constitutional: Negative for fever.  HENT: Negative for sore throat.   Respiratory: Negative for chest tightness and shortness of breath.   Cardiovascular: Negative for chest pain.  Musculoskeletal: Positive for myalgias. Negative for joint pain.  Skin: Positive for color change and rash.  Neurological: Negative for tingling and numbness.    Patient Active Problem List   Diagnosis Date Noted  . Anxiety 06/05/2015  . HLD (hyperlipidemia) 06/24/2014  . History of anticoagulant therapy 12/03/2013  . AF (paroxysmal atrial fibrillation) ([place]) 10/30/2013  . Blood in the urine 01/10/2012  . Essential (primary) hypertension 02/03/2011  . 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 Authorizing Provider  amLODipine (NORVASC) 10 MG tablet Take 10 mg by mouth daily.   Yes Historical Provider, MD  benazepril (LOTENSIN) 20 MG tablet TAKE 3 TABLETS(60 MG) BY MOUTH DAILY 07/04/15  Yes Glean Hess, MD  losartan (COZAAR)  100 MG tablet Take 100 mg by mouth daily.   Yes Historical Provider, MD  metoprolol succinate (TOPROL-XL) 50 MG 24 hr tablet Take 50 mg by mouth daily. Take with or immediately following a meal.   Yes Historical Provider, MD  Multiple Vitamins-Minerals (MULTIVITAMIN WITH MINERALS) tablet Take 1 tablet by mouth daily.   Yes Historical Provider, MD  sertraline (ZOLOFT) 50 MG tablet Take 3 tablets (150 mg total) by mouth daily. 06/29/15  Yes Glean Hess, MD  warfarin (COUMADIN) 3 MG tablet Take 1.5 tablets (4.5 mg total) by mouth daily. 06/30/15  Yes Glean Hess, MD  hydrochlorothiazide (HYDRODIURIL) 25 MG tablet Take 25 mg by mouth as needed. Reported on 08/16/2015    Historical Provider, MD    Allergies  Allergen Reactions  . Darvon [Propoxyphene Hcl] Swelling  . Indocin [Indomethacin] Swelling    Past Surgical History  Procedure Laterality Date  . Colonoscopy  2013    DR.Elliott  . Breast mass excision Left 2011  . Abdominal hysterectomy  1974    Social History  Substance Use Topics  . Smoking status: Former Smoker -- 1.00 packs/day for 10 years    Quit date: 06/05/1978  . Smokeless tobacco: None  . Alcohol Use: No     Medication list has been reviewed and updated.   Physical Exam  Constitutional: She appears well-developed and well-nourished. She appears distressed.  Cardiovascular: Normal rate, regular rhythm and normal heart sounds.   Pulmonary/Chest: Effort normal and breath sounds normal.  Musculoskeletal:  Right shoulder: She exhibits decreased range of motion (due to pain). She exhibits no bony tenderness, no swelling, no crepitus, no deformity, normal pulse and normal strength.  Skin: Rash noted. Rash is maculopapular. There is erythema.     Tender over muscle and skin of upper right arm    BP 122/66 mmHg  Pulse 64  Ht 5\' 6"  (1.676 m)  Wt 201 lb (91.173 kg)  BMI 32.46 kg/m2  Assessment and Plan: 1. Shingles outbreak Continue tylenol every 6  hours as needed for pain Return if needed - valACYclovir (VALTREX) 1000 MG tablet; Take 1 tablet (1,000 mg total) by mouth 3 (three) times daily.  Dispense: 30 tablet; Refill: 0   Halina Maidens, MD [facility]  08/16/2015

## 2015-08-30 DIAGNOSIS — I480 Paroxysmal atrial fibrillation: Secondary | ICD-10-CM | POA: Diagnosis not present

## 2015-09-27 DIAGNOSIS — I480 Paroxysmal atrial fibrillation: Secondary | ICD-10-CM | POA: Diagnosis not present

## 2015-10-11 DIAGNOSIS — I480 Paroxysmal atrial fibrillation: Secondary | ICD-10-CM | POA: Diagnosis not present

## 2015-11-08 DIAGNOSIS — I480 Paroxysmal atrial fibrillation: Secondary | ICD-10-CM | POA: Diagnosis not present

## 2015-12-08 DIAGNOSIS — I480 Paroxysmal atrial fibrillation: Secondary | ICD-10-CM | POA: Diagnosis not present

## 2015-12-10 ENCOUNTER — Ambulatory Visit (INDEPENDENT_AMBULATORY_CARE_PROVIDER_SITE_OTHER): Payer: PPO | Admitting: Internal Medicine

## 2015-12-10 ENCOUNTER — Encounter: Payer: Self-pay | Admitting: Internal Medicine

## 2015-12-10 VITALS — BP 102/64 | HR 58 | Resp 16 | Ht 66.0 in | Wt 196.0 lb

## 2015-12-10 DIAGNOSIS — I480 Paroxysmal atrial fibrillation: Secondary | ICD-10-CM | POA: Diagnosis not present

## 2015-12-10 DIAGNOSIS — D329 Benign neoplasm of meninges, unspecified: Secondary | ICD-10-CM

## 2015-12-10 DIAGNOSIS — I10 Essential (primary) hypertension: Secondary | ICD-10-CM | POA: Diagnosis not present

## 2015-12-10 DIAGNOSIS — R413 Other amnesia: Secondary | ICD-10-CM | POA: Diagnosis not present

## 2015-12-10 NOTE — Progress Notes (Signed)
Date:  12/10/2015   Name:  Amanda Mccann   DOB:  12-May-1941   MRN:  [id number]   Chief Complaint: Memory Loss She has noticed problems with word-finding over the past year.  She rarely uses a substitute.  Sometimes she can visualize the word she wants.  She never uses the wrong word. She misplaces items but does not put them in unusual places. She pays the bills and until recently had kept up well.  Last month forget a few bills. She still drives and has had no issues finding her way. She does not feel depressed but has been on medication for a number of years with no recent relapse or dose change.  Review of Systems  Constitutional: Negative for fever, chills, fatigue and unexpected weight change.  Respiratory: Negative for cough, chest tightness and shortness of breath.   Cardiovascular: Negative for chest pain.  Neurological: Positive for speech difficulty (word finding). Negative for dizziness, light-headedness, numbness and headaches.  Psychiatric/Behavioral: Positive for decreased concentration. Negative for hallucinations, sleep disturbance and dysphoric mood. The patient is not nervous/anxious.     Patient Active Problem List   Diagnosis Date Noted  . Anxiety 06/05/2015  . HLD (hyperlipidemia) 06/24/2014  . History of anticoagulant therapy 12/03/2013  . AF (paroxysmal atrial fibrillation) ([place]) 10/30/2013  . Blood in the urine 01/10/2012  . Essential (primary) hypertension 02/03/2011  . 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 Authorizing Provider  amLODipine (NORVASC) 10 MG tablet Take 10 mg by mouth daily.   Yes Historical Provider, MD  benazepril (LOTENSIN) 20 MG tablet TAKE 3 TABLETS(60 MG) BY MOUTH DAILY 07/04/15  Yes Glean Hess, MD  losartan (COZAAR) 100 MG tablet Take 100 mg by mouth daily.   Yes Historical Provider, MD  metoprolol succinate (TOPROL-XL) 50 MG 24 hr tablet  Take 50 mg by mouth daily. Take with or immediately following a meal.   Yes Historical Provider, MD  Multiple Vitamins-Minerals (MULTIVITAMIN WITH MINERALS) tablet Take 1 tablet by mouth daily.   Yes Historical Provider, MD  sertraline (ZOLOFT) 50 MG tablet Take 3 tablets (150 mg total) by mouth daily. 06/29/15  Yes Glean Hess, MD  warfarin (COUMADIN) 3 MG tablet Take 1.5 tablets (4.5 mg total) by mouth daily. 06/30/15  Yes Glean Hess, MD    Allergies  Allergen Reactions  . Darvon [Propoxyphene Hcl] Swelling  . Indocin [Indomethacin] Swelling    Past Surgical History  Procedure Laterality Date  . Colonoscopy  2013    DR.Elliott  . Breast mass excision Left 2011  . Abdominal hysterectomy  1974    Social History  Substance Use Topics  . Smoking status: Former Smoker -- 1.00 packs/day for 10 years    Quit date: 06/05/1978  . Smokeless tobacco: None  . Alcohol Use: No   Cognitive Testing - 6-CIT   Correct? Score   What year is it? yes 0 Yes = 0    No = 4  What month is it? yes 0 Yes = 0    No = 3  Remember:     [address], [place], [place]     What time is it? yes 0 Yes = 0    No = 3  Count backwards from 20 to 1 yes 0 Correct = 0    1 error = 2   More than 1 error =  4  Say the months of the year in reverse. yes 0 Correct = 0    1 error = 2   More than 1 error = 4  What address did I ask you to remember? no 3 Correct = 0  1 error = 2    2 error = 4    3 error = 6    4 error = 8    All wrong = 10       TOTAL SCORE  3/28   Interpretation:  Normal  Normal (0-7) Abnormal (8-28)     Medication list has been reviewed and updated.   Physical Exam  Constitutional: She is oriented to person, place, and time. She appears well-developed. No distress.  HENT:  Head: Normocephalic and atraumatic.  Cardiovascular: Normal rate, regular rhythm and normal heart sounds.   Pulmonary/Chest: Effort normal and breath sounds normal. No respiratory distress. She has no  wheezes. She has no rales.  Musculoskeletal: She exhibits no edema or tenderness.  Neurological: She is alert and oriented to person, place, and time. She has normal strength and normal reflexes. No cranial nerve deficit.  Skin: Skin is warm and dry. No rash noted.  Psychiatric: She has a normal mood and affect. Her behavior is normal. Judgment and thought content normal. Her mood appears not anxious. Her affect is not labile. Her speech is not delayed and not slurred. Cognition and memory are normal.  Nursing note and vitals reviewed.    BP 102/64 mmHg  Pulse 58  Resp 16  Ht 5\' 6"  (1.676 m)  Wt 196 lb (88.905 kg)  BMI 31.65 kg/m2  SpO2 95%  Assessment and Plan: 1. Memory loss of unknown cause Will check labs and consider Neurology referral for more extensive testing - TSH - Comprehensive metabolic panel  2. Benign neoplasm of meninges (H[facility]) Due for monitoring and to rule out intra-cranial cause for memory issues - MR Brain W Wo Contrast; Future  3. Essential (primary) hypertension controlled  4. AF (paroxysmal atrial fibrillation) ([place]) In SR today Maintained on warfarin - followed by Dr. Derrell Lolling, MD [facility]  12/10/2015

## 2015-12-11 LAB — COMPREHENSIVE METABOLIC PANEL
ALBUMIN: 4.2 g/dL (ref 3.5–4.8)
ALK PHOS: 63 IU/L (ref 39–117)
ALT: 15 IU/L (ref 0–32)
AST: 21 IU/L (ref 0–40)
Albumin/Globulin Ratio: 1.5 (ref 1.2–2.2)
BILIRUBIN TOTAL: 0.3 mg/dL (ref 0.0–1.2)
BUN / CREAT RATIO: 30 — AB (ref 12–28)
BUN: 20 mg/dL (ref 8–27)
CHLORIDE: 101 mmol/L (ref 96–106)
CO2: 25 mmol/L (ref 18–29)
Calcium: 9.2 mg/dL (ref 8.7–10.3)
Creatinine, Ser: 0.67 mg/dL (ref 0.57–1.00)
GFR calc Af Amer: 99 mL/min/{1.73_m2} (ref >59)
GFR calc non Af Amer: 86 mL/min/{1.73_m2} (ref >59)
GLUCOSE: 94 mg/dL (ref 65–99)
Globulin, Total: 2.8 g/dL (ref 1.5–4.5)
Potassium: 4.7 mmol/L (ref 3.5–5.2)
SODIUM: 144 mmol/L (ref 134–144)
Total Protein: 7 g/dL (ref 6.0–8.5)

## 2015-12-11 LAB — TSH: TSH: 1.21 u[IU]/mL (ref 0.450–4.500)

## 2015-12-14 ENCOUNTER — Ambulatory Visit: Payer: PPO | Admitting: Internal Medicine

## 2015-12-15 DIAGNOSIS — I480 Paroxysmal atrial fibrillation: Secondary | ICD-10-CM | POA: Diagnosis not present

## 2015-12-21 DIAGNOSIS — I480 Paroxysmal atrial fibrillation: Secondary | ICD-10-CM | POA: Diagnosis not present

## 2015-12-22 ENCOUNTER — Ambulatory Visit: Payer: PPO

## 2015-12-30 ENCOUNTER — Ambulatory Visit
Admission: RE | Admit: 2015-12-30 | Discharge: 2015-12-30 | Disposition: A | Discharge status: Home / Self Care | Payer: PPO | Source: Ambulatory Visit | Attending: Internal Medicine | Admitting: Internal Medicine

## 2015-12-30 DIAGNOSIS — D329 Benign neoplasm of meninges, unspecified: Secondary | ICD-10-CM | POA: Insufficient documentation

## 2015-12-30 MED ORDER — GADOBENATE DIMEGLUMINE 529 MG/ML IV SOLN
18.0000 mL | Freq: Once | INTRAVENOUS | Status: AC | PRN
  Administered 2015-12-30: 18 mL via INTRAVENOUS

## 2016-01-04 ENCOUNTER — Other Ambulatory Visit: Payer: Self-pay | Admitting: Internal Medicine

## 2016-01-18 DIAGNOSIS — I480 Paroxysmal atrial fibrillation: Secondary | ICD-10-CM | POA: Diagnosis not present

## 2016-02-02 DIAGNOSIS — E7800 Pure hypercholesterolemia, unspecified: Secondary | ICD-10-CM | POA: Diagnosis not present

## 2016-02-02 DIAGNOSIS — I480 Paroxysmal atrial fibrillation: Secondary | ICD-10-CM | POA: Diagnosis not present

## 2016-02-02 DIAGNOSIS — I10 Essential (primary) hypertension: Secondary | ICD-10-CM | POA: Diagnosis not present

## 2016-02-15 DIAGNOSIS — I480 Paroxysmal atrial fibrillation: Secondary | ICD-10-CM | POA: Diagnosis not present

## 2016-02-18 ENCOUNTER — Other Ambulatory Visit: Payer: Self-pay | Admitting: Internal Medicine

## 2016-02-18 ENCOUNTER — Ambulatory Visit (INDEPENDENT_AMBULATORY_CARE_PROVIDER_SITE_OTHER): Payer: PPO | Admitting: Internal Medicine

## 2016-02-18 ENCOUNTER — Encounter: Payer: Self-pay | Admitting: Internal Medicine

## 2016-02-18 VITALS — BP 122/76 | HR 64 | Resp 16 | Ht 66.0 in | Wt 195.0 lb

## 2016-02-18 DIAGNOSIS — N3000 Acute cystitis without hematuria: Secondary | ICD-10-CM

## 2016-02-18 LAB — POC URINALYSIS WITH MICROSCOPIC (NON AUTO)MANUAL RESULT
Bilirubin, UA: NEGATIVE
CRYSTALS: 0
EPITHELIAL CELLS, URINE PER MICROSCOPY: 1
Glucose, UA: NEGATIVE
KETONES UA: NEGATIVE
MUCUS UA: 0
Nitrite, UA: POSITIVE
PH UA: 6
PROTEIN UA: NEGATIVE
RBC: 2 M/uL — AB (ref 4.04–5.48)
Spec Grav, UA: <=1.005
Urobilinogen, UA: 0.2
WBC CASTS UA: 5

## 2016-02-18 MED ORDER — CEFUROXIME AXETIL 250 MG PO TABS: 250.0000 mg | ORAL_TABLET | Freq: Two times a day (BID) | ORAL | 0 refills | Status: DC

## 2016-02-18 MED ORDER — NITROFURANTOIN MONOHYD MACRO 100 MG PO CAPS: 100.0000 mg | ORAL_CAPSULE | Freq: Two times a day (BID) | ORAL | 0 refills | Status: DC

## 2016-02-18 NOTE — Patient Instructions (Signed)

## 2016-02-18 NOTE — Progress Notes (Signed)
Date:  02/18/2016   Name:  Amanda Mccann   DOB:  03/06/1941   MRN:  [id number]   Chief Complaint: Urinary Tract Infection (cramping and abdominal pain)  Urinary Tract Infection   This is a new problem. The current episode started in the past 7 days. The patient is experiencing no pain. There has been no fever. Pertinent negatives include no chills, frequency, hematuria, nausea or urgency. Associated symptoms comments: Abdominal pressure and odor to urine. She has tried acetaminophen for the symptoms.     Review of Systems  Constitutional: Negative for chills, fatigue and fever.  Cardiovascular: Negative for chest pain, palpitations and leg swelling.  Gastrointestinal: Positive for abdominal pain. Negative for nausea.  Endocrine: Negative for polydipsia and polyuria.  Genitourinary: Negative for frequency, hematuria and urgency.  Musculoskeletal: Negative for back pain.  Neurological: Negative for dizziness and headaches.    Patient Active Problem List   Diagnosis Date Noted  . Anxiety 06/05/2015  . HLD (hyperlipidemia) 06/24/2014  . On warfarin therapy 12/03/2013  . AF (paroxysmal atrial fibrillation) ([place]) 10/30/2013  . Blood in the urine 01/10/2012  . Essential (primary) hypertension 02/03/2011  . 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 Authorizing Provider  amLODipine (NORVASC) 10 MG tablet Take 10 mg by mouth daily.   Yes Historical Provider, MD  benazepril (LOTENSIN) 20 MG tablet TAKE 3 TABLETS(60 MG) BY MOUTH DAILY 07/04/15  Yes Glean Hess, MD  losartan (COZAAR) 100 MG tablet Take 100 mg by mouth daily.   Yes Historical Provider, MD  metoprolol (LOPRESSOR) 50 MG tablet  02/17/16  Yes Historical Provider, MD  Multiple Vitamins-Minerals (MULTIVITAMIN WITH MINERALS) tablet Take 1 tablet by mouth daily.   Yes Historical Provider, MD  sertraline (ZOLOFT) 50 MG tablet Take 3 tablets  by mouth daily 01/04/16  Yes Glean Hess, MD  warfarin (COUMADIN) 1 MG tablet  02/08/16  Yes Historical Provider, MD  warfarin (COUMADIN) 3 MG tablet Take 1.5 tablets (4.5 mg total) by mouth daily. 06/30/15  Yes Glean Hess, MD    Allergies  Allergen Reactions  . Darvon [Propoxyphene Hcl] Swelling  . Indocin [Indomethacin] Swelling    Past Surgical History:  Procedure Laterality Date  . ABDOMINAL HYSTERECTOMY  1974  . BREAST MASS EXCISION Left 2011  . COLONOSCOPY  2013   DR.Elliott    Social History  Substance Use Topics  . Smoking status: Former Smoker    Packs/day: 1.00    Years: 10.00    Quit date: 06/05/1978  . Smokeless tobacco: Never Used  . Alcohol use No     Medication list has been reviewed and updated.   Physical Exam  Constitutional: She appears well-developed and well-nourished.  Cardiovascular: Normal rate, regular rhythm and normal heart sounds.   Pulmonary/Chest: Effort normal and breath sounds normal. No respiratory distress.  Abdominal: Soft. Bowel sounds are normal. There is no tenderness. There is no rebound, no guarding and no CVA tenderness.  Psychiatric: She has a normal mood and affect.  Nursing note and vitals reviewed.   BP 122/76   Pulse 64   Resp 16   Ht 5\' 6"  (1.676 m)   Wt 195 lb (88.5 kg)   SpO2 97%   BMI 31.47 kg/m   Assessment and Plan: 1. Acute cystitis without hematuria Continue to push fluids - POC urinalysis w microscopic (non auto) - nitrofurantoin, macrocrystal-monohydrate, (MACROBID)  100 MG capsule; Take 1 capsule (100 mg total) by mouth 2 (two) times daily.  Dispense: 14 capsule; Refill: 0   Halina Maidens, MD [facility]  02/18/2016

## 2016-03-07 DIAGNOSIS — I480 Paroxysmal atrial fibrillation: Secondary | ICD-10-CM | POA: Diagnosis not present

## 2016-03-31 ENCOUNTER — Encounter: Payer: Self-pay | Admitting: Internal Medicine

## 2016-03-31 ENCOUNTER — Ambulatory Visit (INDEPENDENT_AMBULATORY_CARE_PROVIDER_SITE_OTHER): Payer: PPO | Admitting: Internal Medicine

## 2016-03-31 ENCOUNTER — Encounter (INDEPENDENT_AMBULATORY_CARE_PROVIDER_SITE_OTHER): Payer: Self-pay

## 2016-03-31 DIAGNOSIS — G8929 Other chronic pain: Secondary | ICD-10-CM

## 2016-03-31 DIAGNOSIS — M25562 Pain in left knee: Secondary | ICD-10-CM | POA: Insufficient documentation

## 2016-03-31 NOTE — Progress Notes (Signed)
Date:  03/31/2016   Name:  Amanda Mccann   DOB:  08/20/1940   MRN:  [id number]   Chief Complaint: Knee Pain (Left Knee pain x 1 week. Was working in yard last week and ran from animal and twisted it wrong. Knee is very weak when adding any body weight and aches even when sitting still at times. HX of similar injury 3 years ago with more severe pain. Pain only with moving now. )  Knee Pain   The incident occurred more than 1 week ago (but had initial injury 3 yrs ago). The incident occurred at home. The injury mechanism was a twisting injury. The pain is present in the left knee. The quality of the pain is described as aching.  She has been "babying" her knee for the past few years - using a brace and rubs.  Usually gets better after a few weeks of rest but not this time.   Review of Systems  Constitutional: Negative for chills, fatigue and fever.  Respiratory: Negative for cough, choking and shortness of breath.   Cardiovascular: Negative for chest pain, palpitations and leg swelling.  Musculoskeletal: Positive for arthralgias and gait problem. Negative for joint swelling.    Patient Active Problem List   Diagnosis Date Noted  . Anxiety 06/05/2015  . HLD (hyperlipidemia) 06/24/2014  . On warfarin therapy 12/03/2013  . AF (paroxysmal atrial fibrillation) ([place]) 10/30/2013  . Blood in the urine 01/10/2012  . Essential (primary) hypertension 02/03/2011  . 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 Authorizing Provider  amLODipine (NORVASC) 10 MG tablet Take 10 mg by mouth daily.   Yes Historical Provider, MD  benazepril (LOTENSIN) 20 MG tablet TAKE 3 TABLETS(60 MG) BY MOUTH DAILY 07/04/15  Yes Glean Hess, MD  losartan (COZAAR) 100 MG tablet Take 100 mg by mouth daily.   Yes Historical Provider, MD  metoprolol (LOPRESSOR) 50 MG tablet  02/17/16  Yes Historical Provider, MD  Multiple  Vitamins-Minerals (MULTIVITAMIN WITH MINERALS) tablet Take 1 tablet by mouth daily.   Yes Historical Provider, MD  sertraline (ZOLOFT) 50 MG tablet Take 3 tablets by mouth daily 01/04/16  Yes Glean Hess, MD  warfarin (COUMADIN) 1 MG tablet TAKE 1 TABLET BY MOUTH ONCE DAILY 03/09/16  Yes Historical Provider, MD  warfarin (COUMADIN) 3 MG tablet Take 1.5 tablets (4.5 mg total) by mouth daily. 06/30/15  Yes Glean Hess, MD    Allergies  Allergen Reactions  . Darvon [Propoxyphene Hcl] Swelling  . Indocin [Indomethacin] Swelling    Past Surgical History:  Procedure Laterality Date  . ABDOMINAL HYSTERECTOMY  1974  . BREAST MASS EXCISION Left 2011  . COLONOSCOPY  2013   DR.Elliott    Social History  Substance Use Topics  . Smoking status: Former Smoker    Packs/day: 1.00    Years: 10.00    Quit date: 06/05/1978  . Smokeless tobacco: Never Used  . Alcohol use No     Medication list has been reviewed and updated.   Physical Exam  Constitutional: She is oriented to person, place, and time. She appears well-developed. No distress.  HENT:  Head: Normocephalic and atraumatic.  Cardiovascular: Normal rate, regular rhythm and normal heart sounds.   Pulmonary/Chest: Effort normal and breath sounds normal. No respiratory distress.  Musculoskeletal: Normal range of motion.       Left knee: She exhibits swelling. She  exhibits no ecchymosis and normal patellar mobility. Tenderness found. Lateral joint line tenderness noted.  Neurological: She is alert and oriented to person, place, and time.  Skin: Skin is warm and dry. No rash noted.  Psychiatric: She has a normal mood and affect. Her behavior is normal. Thought content normal.  Nursing note and vitals reviewed.   BP 128/78   Pulse 68   Resp 16   Ht 5\' 6"  (1.676 m)   Wt 197 lb (89.4 kg)   SpO2 97%   BMI 31.80 kg/m   Assessment and Plan: 1. 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

## 2016-03-31 NOTE — Patient Instructions (Signed)
Tylenol 500 mg 1-2 twice a day Continue support and rubs if helpful

## 2016-04-04 DIAGNOSIS — I480 Paroxysmal atrial fibrillation: Secondary | ICD-10-CM | POA: Diagnosis not present

## 2016-04-04 DIAGNOSIS — M1712 Unilateral primary osteoarthritis, left knee: Secondary | ICD-10-CM | POA: Diagnosis not present

## 2016-04-04 DIAGNOSIS — M2392 Unspecified internal derangement of left knee: Secondary | ICD-10-CM | POA: Diagnosis not present

## 2016-04-04 DIAGNOSIS — M25562 Pain in left knee: Secondary | ICD-10-CM | POA: Diagnosis not present

## 2016-04-06 ENCOUNTER — Other Ambulatory Visit: Payer: Self-pay | Admitting: Internal Medicine

## 2016-04-13 ENCOUNTER — Other Ambulatory Visit: Payer: Self-pay | Admitting: Internal Medicine

## 2016-05-02 DIAGNOSIS — I480 Paroxysmal atrial fibrillation: Secondary | ICD-10-CM | POA: Diagnosis not present

## 2016-05-09 ENCOUNTER — Ambulatory Visit
Admission: EM | Admit: 2016-05-09 | Discharge: 2016-05-09 | Disposition: A | Discharge status: Home / Self Care | Payer: PPO | Attending: Family Medicine | Admitting: Family Medicine

## 2016-05-09 ENCOUNTER — Ambulatory Visit (INDEPENDENT_AMBULATORY_CARE_PROVIDER_SITE_OTHER): Payer: PPO

## 2016-05-09 DIAGNOSIS — M179 Osteoarthritis of knee, unspecified: Secondary | ICD-10-CM | POA: Diagnosis not present

## 2016-05-09 DIAGNOSIS — M170 Bilateral primary osteoarthritis of knee: Secondary | ICD-10-CM

## 2016-05-09 NOTE — ED Triage Notes (Signed)
Patient states that around 530pm last night she got out of her car and her right knee gave way. Patient states that progressively her knee started worsening. Patient states that she is having significant knee swelling. Patient states that she has been unable to bear weight.

## 2016-05-09 NOTE — ED Provider Notes (Signed)
CSN: [id number]     Arrival date & time 05/09/16  [id number] History   First MD Initiated Contact with Patient 05/09/16 1041     Chief Complaint  Patient presents with  . Knee Pain    Right   (Consider location/radiation/quality/duration/timing/severity/associated sxs/prior Treatment) HPI  75 year old female who presents with sudden onset of right knee pain. She states that she recently had injured her left knee have been examined by an orthopedist who stated that she had patellofemoral osteoarthritis as well as a possible meniscal tear. He's been taking Celebrex because of being on warfarin for atrial fibrillation. Initially examined 04/04/2016 and since that time has been favoring her left knee by utilizing her right  more especially with ascending and descending stairs. 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 He states it has not popped or clicked but has been giving way. She has not Fallen. He has noticed more swelling of her knee. She awoke at 5 AM and has not walk on it since that time.      Past Medical History:  Diagnosis Date  . A-fib ([place])   . Arthritis 1970  . 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 Hyperlipidemia   . Hypertension 1995   Past Surgical History:  Procedure Laterality Date  . ABDOMINAL HYSTERECTOMY  1974  . BREAST MASS EXCISION Left 2011  . COLONOSCOPY  2013   DR.Elliott   Family History  Problem Relation Age of Onset  . Hypertension Mother   . Stroke Mother   . Dementia Mother   . Hypertension Father    Social History  Substance Use Topics  . Smoking status: Former Smoker    Packs/day: 1.00    Years: 10.00    Quit date: 06/05/1978  . Smokeless tobacco: Never Used  . Alcohol use No   OB History    Gravida Para Term Preterm AB Living   0             SAB TAB Ectopic Multiple Live Births                  Obstetric Comments   First menstrual  12     Review of Systems  Constitutional: Positive for activity change. Negative for chills, fatigue and fever.  Musculoskeletal: Positive for arthralgias and myalgias.  All other systems reviewed and are negative.   Allergies  Darvon [propoxyphene hcl] and Indocin [indomethacin]  Home Medications   Prior to Admission medications   Medication Sig Start Date End Date Taking? Authorizing Provider  amLODipine (NORVASC) 10 MG tablet Take 10 mg by mouth daily.   Yes Historical Provider, MD  benazepril (LOTENSIN) 20 MG tablet TAKE 3 TABLETS(60 MG) BY MOUTH DAILY 07/04/15  Yes Glean Hess, MD  losartan (COZAAR) 100 MG tablet Take 100 mg by mouth daily.   Yes Historical Provider, MD  metoprolol (LOPRESSOR) 50 MG tablet  02/17/16  Yes Historical Provider, MD  Multiple Vitamins-Minerals (MULTIVITAMIN WITH MINERALS) tablet Take 1 tablet by mouth daily.   Yes Historical Provider, MD  sertraline (ZOLOFT) 50 MG tablet Take 3 tablets by mouth daily 04/06/16  Yes Glean Hess, MD  warfarin (COUMADIN) 1 MG tablet TAKE 1 TABLET BY MOUTH ONCE DAILY 03/09/16  Yes Historical Provider, MD  warfarin (COUMADIN) 3 MG tablet Take 1.5 tablets (4.5 mg total) by mouth daily.  06/30/15  Yes Glean Hess, MD   Meds Ordered and Administered this Visit  Medications - No data to display  BP (!) 147/56 (BP Location: Left Arm)   Pulse 63   Temp 97.9 F (36.6 C) (Oral)   Resp 17   Ht 5\' 5"  (1.651 m)   Wt 200 lb (90.7 kg)   SpO2 97%   BMI 33.28 kg/m  No data found.   Physical Exam  Constitutional: She is oriented to person, place, and time. She appears well-developed and well-nourished. No distress.  HENT:  Head: Normocephalic and atraumatic.  Eyes: EOM are normal. Pupils are equal, round, and reactive to light.  Neck: Normal range of motion. Neck supple.  Musculoskeletal: She exhibits edema and tenderness.  Examination of the right knee shows swelling  suprapatellarly. The patient is unable to bear weight on the knee without it giving way due to pain. He does have a ecchymotic area on the medial distal upper leg which she states she struck it recently and bleeds freely from the warfarin. She is very tender over the medial joint line and lateral joint line as well as retropatellar. Lateral collateral medial collateral ligaments are intact to clinical stressing. She has a negative anterior drawer sign. Is reluctant to fully extend the knee encouragement I am able to extend her to approximately -5 to -10 of full extension.  Neurological: She is alert and oriented to person, place, and time.  Skin: Skin is warm and dry. She is not diaphoretic.  Psychiatric: She has a normal mood and affect. Her behavior is normal. Judgment and thought content normal.  Nursing note and vitals reviewed.   Urgent Care Course   Clinical Course     Procedures (including critical care time)  Labs Review Labs Reviewed - No data to display  Imaging Review Dg Knee Complete 4 Views Right  Result Date: 05/09/2016 CLINICAL DATA:  Pain swelling.  No known injury. EXAM: RIGHT KNEE - COMPLETE 4+ VIEW COMPARISON:  02/18/2010 . FINDINGS: No acute bony abnormality identified. No evidence of fracture or dislocation. Prominent knee joint effusion. Loose bodies appear to be present. IMPRESSION: 1. No acute bony abnormality identified. No evidence of fracture or dislocation. 2. Tricompartment degenerative change with loose bodies. Prominent knee joint effusion. Electronically Signed   By: Marcello Moores  Register   On: 05/09/2016 11:43     Visual Acuity Review  Right Eye Distance:   Left Eye Distance:   Bilateral Distance:    Right Eye Near:   Left Eye Near:    Bilateral Near:         MDM   1. Primary osteoarthritis of both knees    New Prescriptions   No medications on file   Long discussion with the patient and her husband. I recommended that she needs to return to  the orthopedist in order to have further evaluation and decide on a treatment program based on the findings. Provided her with a disc of the x-rays. I also recommended that she use a walker for ambulation. A fall while being on warfarin could be catastrophic to her. I've also recommended that she contact her cardiologist regarding the use of anti-inflammatory medications with her warfarin. Her INRs have been running slightly high and they've been adjusting it every 2 weeks. Examination of her knee today was less than ideal due to her pain or inability to cooperate with the examination adequately.   Lorin Picket, PA-C 05/09/16 1225

## 2016-05-12 ENCOUNTER — Telehealth: Payer: Self-pay

## 2016-05-12 NOTE — Telephone Encounter (Signed)
Courtesy call back completed today after patient's visit at [facility]. Patient improved and will call back with any questions or concerns.  

## 2016-05-16 DIAGNOSIS — I480 Paroxysmal atrial fibrillation: Secondary | ICD-10-CM | POA: Diagnosis not present

## 2016-05-18 DIAGNOSIS — M1711 Unilateral primary osteoarthritis, right knee: Secondary | ICD-10-CM | POA: Diagnosis not present

## 2016-05-18 DIAGNOSIS — M2341 Loose body in knee, right knee: Secondary | ICD-10-CM | POA: Diagnosis not present

## 2016-05-31 DIAGNOSIS — I480 Paroxysmal atrial fibrillation: Secondary | ICD-10-CM | POA: Diagnosis not present

## 2016-06-27 DIAGNOSIS — I480 Paroxysmal atrial fibrillation: Secondary | ICD-10-CM | POA: Diagnosis not present

## 2016-07-14 ENCOUNTER — Other Ambulatory Visit: Payer: Self-pay | Admitting: Internal Medicine

## 2016-07-14 ENCOUNTER — Telehealth: Payer: Self-pay

## 2016-07-14 MED ORDER — SERTRALINE HCL 50 MG PO TABS: 150.0000 mg | ORAL_TABLET | Freq: Every day | ORAL | 1 refills | Status: AC

## 2016-07-14 NOTE — Telephone Encounter (Signed)
Patient called requesting refills on Sertraline 50mg  tablets. She is also requesting that it can be a 90 day supply sent into [facility] in [place].

## 2016-07-25 DIAGNOSIS — I10 Essential (primary) hypertension: Secondary | ICD-10-CM | POA: Diagnosis not present

## 2016-07-25 DIAGNOSIS — E7800 Pure hypercholesterolemia, unspecified: Secondary | ICD-10-CM | POA: Diagnosis not present

## 2016-07-25 DIAGNOSIS — I480 Paroxysmal atrial fibrillation: Secondary | ICD-10-CM | POA: Diagnosis not present

## 2016-08-22 DIAGNOSIS — I4891 Unspecified atrial fibrillation: Secondary | ICD-10-CM | POA: Diagnosis not present

## 2016-08-22 DIAGNOSIS — Z7901 Long term (current) use of anticoagulants: Secondary | ICD-10-CM | POA: Diagnosis not present

## 2016-09-14 DIAGNOSIS — R4189 Other symptoms and signs involving cognitive functions and awareness: Secondary | ICD-10-CM | POA: Diagnosis not present

## 2016-09-14 DIAGNOSIS — F419 Anxiety disorder, unspecified: Secondary | ICD-10-CM | POA: Diagnosis not present

## 2016-09-14 DIAGNOSIS — I480 Paroxysmal atrial fibrillation: Secondary | ICD-10-CM | POA: Diagnosis not present

## 2016-09-14 DIAGNOSIS — Z7901 Long term (current) use of anticoagulants: Secondary | ICD-10-CM | POA: Diagnosis not present

## 2016-09-14 DIAGNOSIS — M170 Bilateral primary osteoarthritis of knee: Secondary | ICD-10-CM | POA: Diagnosis not present

## 2016-09-14 DIAGNOSIS — I10 Essential (primary) hypertension: Secondary | ICD-10-CM | POA: Diagnosis not present

## 2016-09-19 DIAGNOSIS — Z7901 Long term (current) use of anticoagulants: Secondary | ICD-10-CM | POA: Diagnosis not present

## 2016-09-19 DIAGNOSIS — I480 Paroxysmal atrial fibrillation: Secondary | ICD-10-CM | POA: Diagnosis not present

## 2018-02-26 IMAGING — CR DG KNEE COMPLETE 4+V*R*
4 series · 4 of 4 positions shown · non-contrast
Comparison: 02/18/2010 .

CLINICAL DATA: Pain swelling.  No known injury.

EXAM:
RIGHT KNEE - COMPLETE 4+ VIEW

[knee ap (1 of 3)]
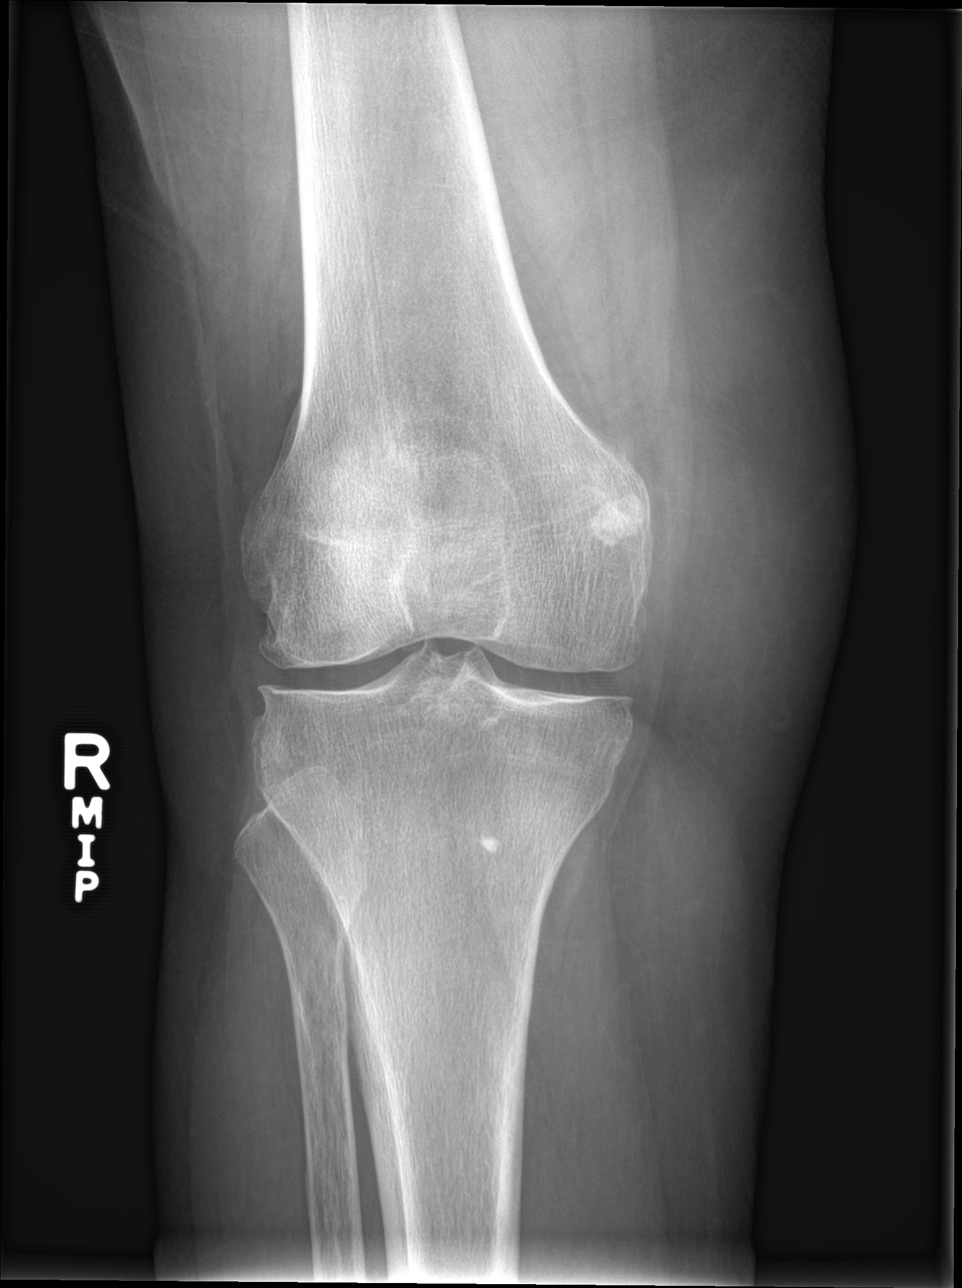

[knee ap (2 of 3)]
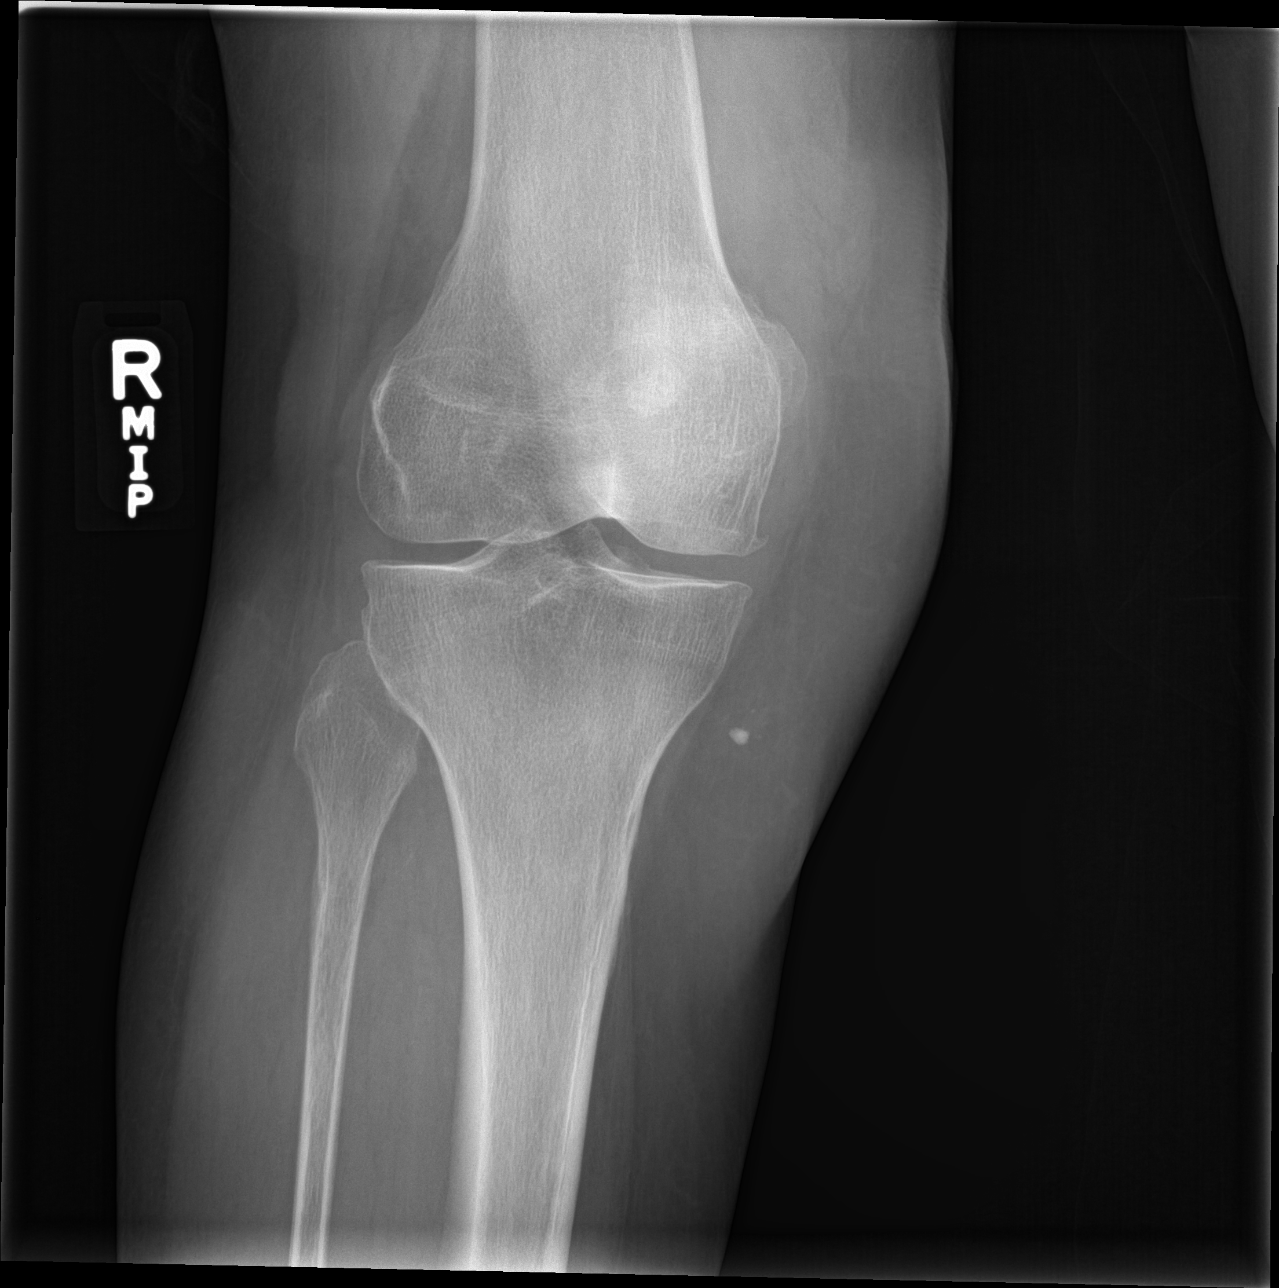

[knee lat]
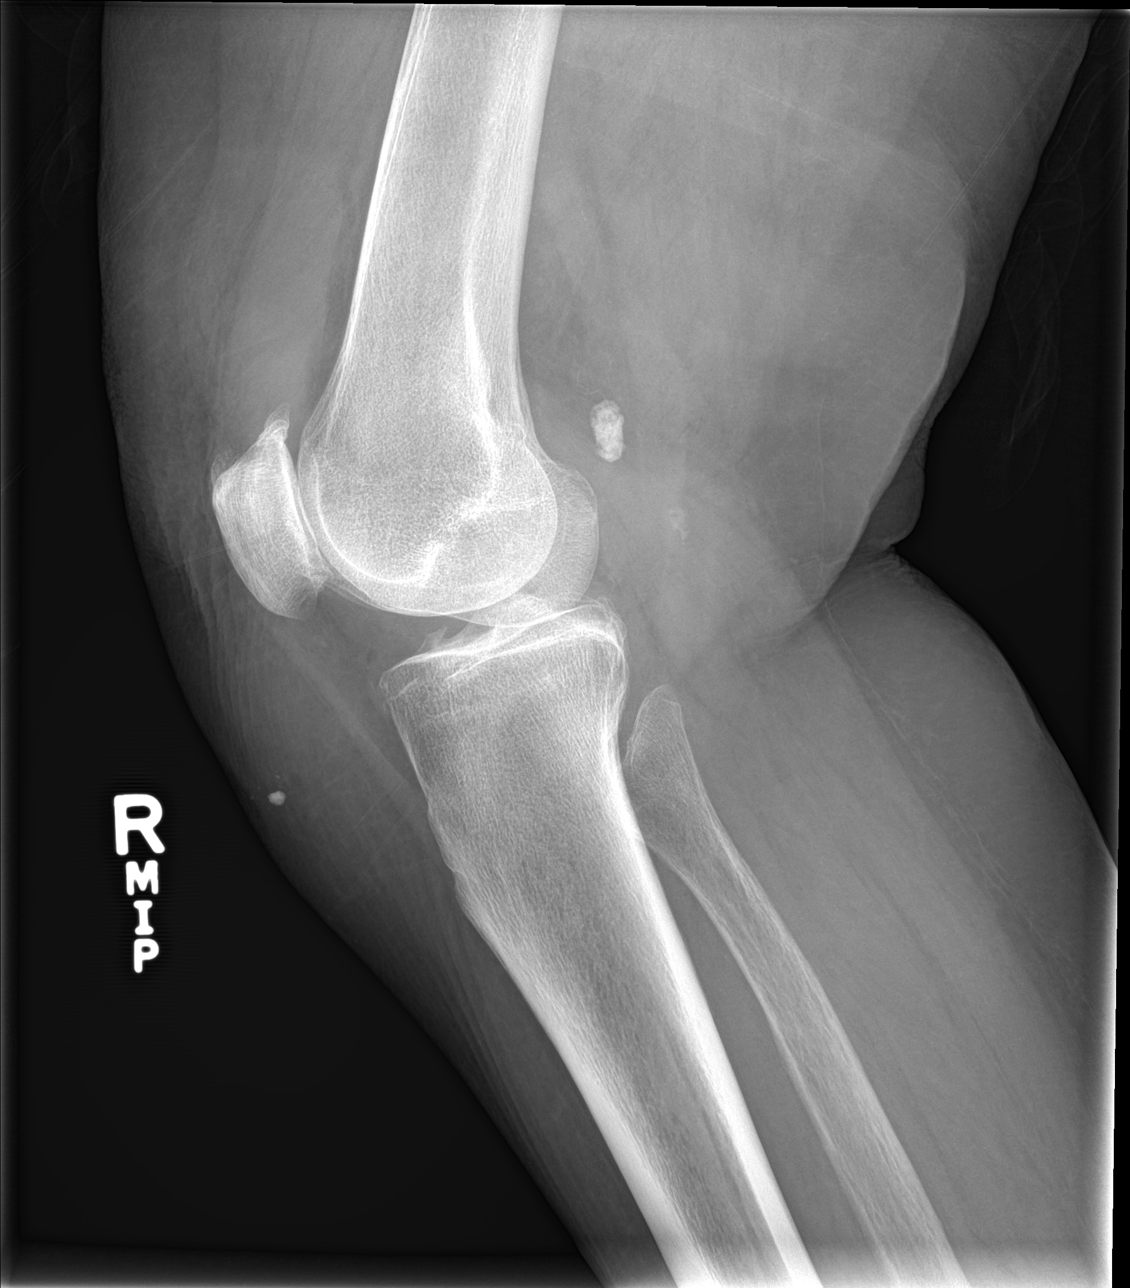

[knee ap (3 of 3)]
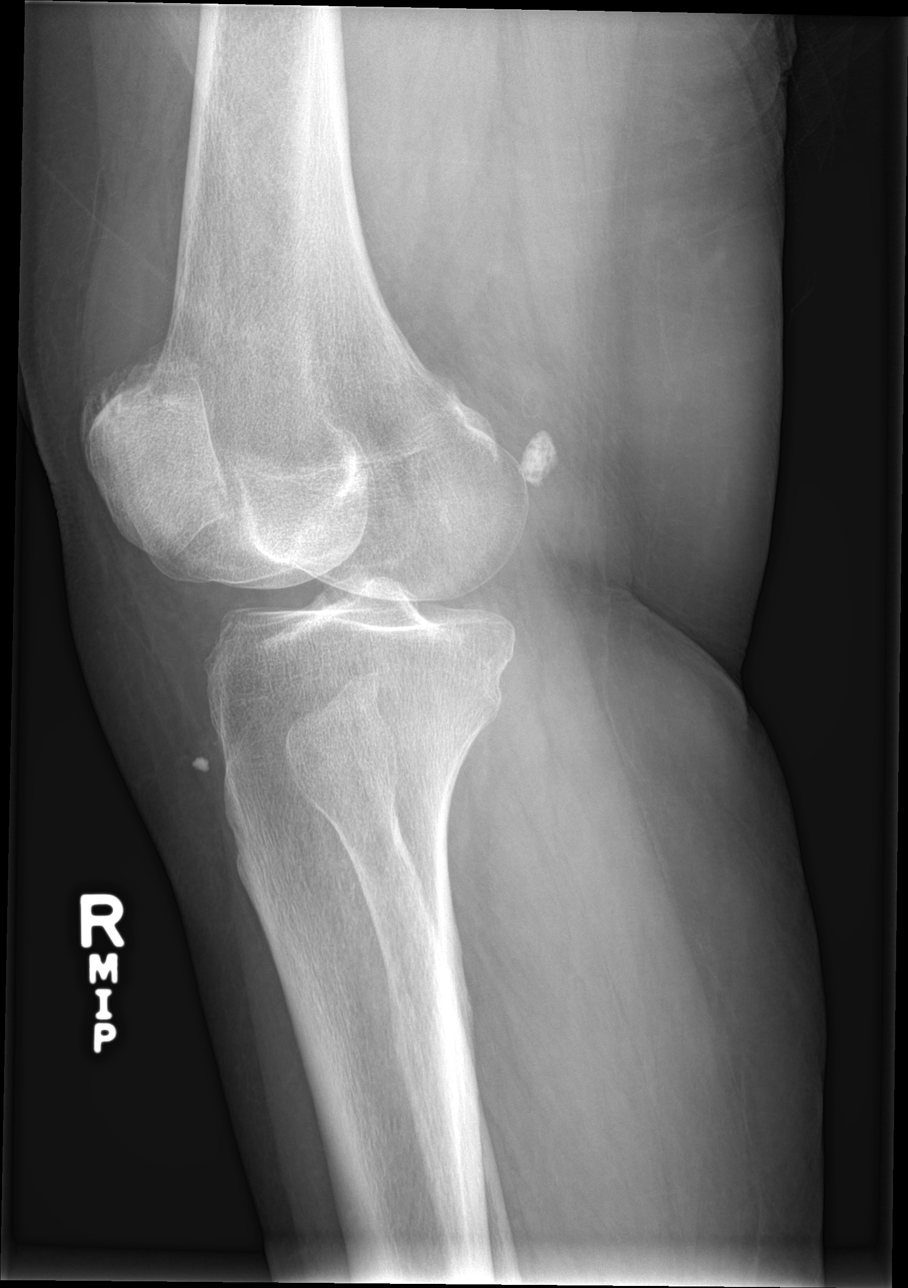

[4 of 4 positions shown; findings below may reference images not displayed]

FINDINGS: No acute bony abnormality identified. No evidence of fracture or
dislocation. Prominent knee joint effusion. Loose bodies appear to
be present.
IMPRESSION: 1. No acute bony abnormality identified. No evidence of fracture or
dislocation.

2. Tricompartment degenerative change with loose bodies. Prominent
knee joint effusion.
